# Patient Record
Sex: Female | Born: 1985 | Race: White | Hispanic: No | Marital: Married | Smoking: Never smoker
Health system: Southern US, Community
[De-identification: ages and names within clinical notes are randomized; demographics above are authoritative.]

## PROBLEM LIST (undated history)

## (undated) ENCOUNTER — Inpatient Hospital Stay (HOSPITAL_COMMUNITY): Payer: Self-pay

## (undated) DIAGNOSIS — D6800 Von Willebrand disease, unspecified: Secondary | ICD-10-CM

## (undated) DIAGNOSIS — D649 Anemia, unspecified: Secondary | ICD-10-CM

## (undated) HISTORY — DX: Von Willebrand disease, unspecified: D68.00

## (undated) HISTORY — DX: Von Willebrand's disease: D68.0

## (undated) HISTORY — DX: Hemochromatosis, unspecified: E83.119

## (undated) HISTORY — PX: BREAST ENHANCEMENT SURGERY: SHX7

## (undated) HISTORY — DX: Anemia, unspecified: D64.9

## (undated) HISTORY — PX: OTHER SURGICAL HISTORY: SHX169

---

## 2011-07-15 ENCOUNTER — Encounter (HOSPITAL_COMMUNITY): Payer: Self-pay | Admitting: Emergency Medicine

## 2011-07-15 ENCOUNTER — Emergency Department (HOSPITAL_COMMUNITY)
Admission: EM | Admit: 2011-07-15 | Discharge: 2011-07-15 | Disposition: A | Discharge status: Home / Self Care | Attending: Emergency Medicine | Admitting: Emergency Medicine

## 2011-07-15 DIAGNOSIS — R51 Headache: Secondary | ICD-10-CM | POA: Insufficient documentation

## 2011-07-15 DIAGNOSIS — J3489 Other specified disorders of nose and nasal sinuses: Secondary | ICD-10-CM | POA: Insufficient documentation

## 2011-07-15 DIAGNOSIS — R0982 Postnasal drip: Secondary | ICD-10-CM | POA: Insufficient documentation

## 2011-07-15 DIAGNOSIS — J329 Chronic sinusitis, unspecified: Secondary | ICD-10-CM | POA: Insufficient documentation

## 2011-07-15 MED ORDER — AMOXICILLIN 500 MG PO CAPS: 1000.0000 mg | ORAL_CAPSULE | Freq: Three times a day (TID) | ORAL | Status: AC

## 2011-07-15 MED ORDER — OXYMETAZOLINE HCL 0.05 % NA SOLN
1.0000 | Freq: Once | NASAL | Status: AC
  Administered 2011-07-15: 1 via NASAL
  Filled 2011-07-15: qty 15

## 2011-07-15 MED ORDER — AMOXICILLIN 500 MG PO CAPS
1000.0000 mg | ORAL_CAPSULE | Freq: Once | ORAL | Status: AC
  Administered 2011-07-15: 1000 mg via ORAL
  Filled 2011-07-15: qty 2

## 2011-07-15 NOTE — ED Notes (Signed)
Pt c/o facial pain and sore throat x2 days, reports she feels like she has a sinus infection. Pt is [redacted] weeks pregnant and is unsure of what she can take d/t her pregnancy. PT also reports nauseous d/t pain

## 2011-07-15 NOTE — ED Notes (Addendum)
PT. REPORTS SINUS CONGESTION , RUNNY NOSE WITH SINUS  PRESSURE AND  PAIN FOR SEVERAL DAYS . PT. STATES SHE IS [redacted] WEEKS PREGNANT.

## 2011-07-15 NOTE — ED Provider Notes (Signed)
History     CSN: [id number]  Arrival date & time 07/15/11  4098   First MD Initiated Contact with Patient 07/15/11 0121      Chief Complaint  Patient presents with  . Facial Pain    (Consider location/radiation/quality/duration/timing/severity/associated sxs/prior treatment) HPI Comments: 26 year old female with a history of approximately one week of nasal congestion and drainage followed by approximately 36 hours of gradually onset gradually worsening facial pain over the bilateral maxillary and frontal sinuses. This is constant, severe, not associated with fever or vomiting. She does have significant amount of purulent drainage from her nose, states that she is constantly blowing her nose and has postnasal drip. She does have a history of approximately one or 2 a year or sinus infections for most of her life. She is pregnant approximately 8 weeks and has arty followup with OB/GYN.  The history is provided by the patient and the spouse.    History reviewed. No pertinent past medical history.  History reviewed. No pertinent past surgical history.  No family history on file.  History  Substance Use Topics  . Smoking status: Never Smoker   . Smokeless tobacco: Not on file  . Alcohol Use: No    OB History    Grav Para Term Preterm Abortions TAB SAB Ect Mult Living   1               Review of Systems  Constitutional: Negative for fever and chills.  HENT: Positive for congestion, sore throat and sinus pressure. Negative for hearing loss, ear pain, facial swelling, mouth sores, trouble swallowing, neck pain, neck stiffness, dental problem and ear discharge.   Respiratory: Negative for cough and shortness of breath.   Gastrointestinal: Negative for nausea and vomiting.  Skin: Negative for rash.    Allergies  Aspirin  Home Medications   Current Outpatient Rx  Name Route Sig Dispense Refill  . STRESS FORMULA/ZINC PO TABS Oral Take 1 tablet by mouth daily.    Marland Kitchen CALCIUM  CARBONATE 600 MG PO TABS Oral Take 600 mg by mouth daily.    Marland Kitchen PRENATAL PO Oral Take 1 tablet by mouth daily.    . AMOXICILLIN 500 MG PO CAPS Oral Take 2 capsules (1,000 mg total) by mouth 3 (three) times daily. 60 capsule 0    BP 110/66  Pulse 118  Temp(Src) 98.6 F (37 C) (Oral)  Resp 19  SpO2 99%  Physical Exam  Nursing note and vitals reviewed. Constitutional: She appears well-developed and well-nourished. No distress.  HENT:  Head: Normocephalic and atraumatic.  Mouth/Throat: Oropharynx is clear and moist. No oropharyngeal exudate.       Bilateral nostrils with significant turbinate swelling, discharge in the nares, tenderness over the maxillary and frontal sinuses, no swelling, no rashes  Eyes: Conjunctivae and EOM are normal. Pupils are equal, round, and reactive to light. Right eye exhibits no discharge. Left eye exhibits no discharge. No scleral icterus.  Neck: Normal range of motion. Neck supple. No JVD present. No thyromegaly present.       Supple neck without any meningismus, no lymphadenopathy present  Cardiovascular: Regular rhythm, normal heart sounds and intact distal pulses.  Exam reveals no gallop and no friction rub.   No murmur heard.      Mild tachycardia  Pulmonary/Chest: Effort normal and breath sounds normal. No respiratory distress. She has no wheezes. She has no rales.  Musculoskeletal: Normal range of motion. She exhibits no edema and no tenderness.  Lymphadenopathy:  She has no cervical adenopathy.  Neurological: She is alert. Coordination normal.  Skin: Skin is warm and dry. No rash noted. No erythema.  Psychiatric: She has a normal mood and affect. Her behavior is normal.    ED Course  Procedures (including critical care time)  Labs Reviewed - No data to display No results found.   1. Sinusitis       MDM  On exam the patient has signs and symptoms consistent with a sinusitis. She has a pulse of approximately 105 on my exam, is tolerating  oral fluids and foods without difficulty, has been given amoxicillin and Afrin nasal spray in the emergency department. Will be discharged home to followup with family doctor. She is aware of indications for return.  Discharge Prescriptions include:  #1 amoxicillin  #2 Afrin        Vida Roller, MD 07/15/11 [phone]

## 2012-10-26 ENCOUNTER — Ambulatory Visit (INDEPENDENT_AMBULATORY_CARE_PROVIDER_SITE_OTHER): Payer: 59 | Admitting: Obstetrics and Gynecology

## 2012-10-26 ENCOUNTER — Encounter: Payer: Self-pay | Admitting: Obstetrics and Gynecology

## 2012-10-26 VITALS — BP 122/78 | Ht 68.0 in | Wt 148.0 lb

## 2012-10-26 DIAGNOSIS — E041 Nontoxic single thyroid nodule: Secondary | ICD-10-CM

## 2012-10-26 DIAGNOSIS — O0991 Supervision of high risk pregnancy, unspecified, first trimester: Secondary | ICD-10-CM

## 2012-10-26 DIAGNOSIS — O0990 Supervision of high risk pregnancy, unspecified, unspecified trimester: Secondary | ICD-10-CM

## 2012-10-26 DIAGNOSIS — Z3491 Encounter for supervision of normal pregnancy, unspecified, first trimester: Secondary | ICD-10-CM

## 2012-10-26 DIAGNOSIS — D680 Von Willebrand's disease: Secondary | ICD-10-CM

## 2012-10-26 DIAGNOSIS — Z3480 Encounter for supervision of other normal pregnancy, unspecified trimester: Secondary | ICD-10-CM

## 2012-10-26 MED ORDER — PRENATABS FA PO TABS: 1.0000 | ORAL_TABLET | ORAL | Status: DC

## 2012-10-26 NOTE — Patient Instructions (Signed)
Morning Sickness Morning sickness is when you feel sick to your stomach (nauseous) during pregnancy. This nauseous feeling may or may not come with throwing up (vomiting). It often occurs in the morning, but can be a problem any time of day. While morning sickness is unpleasant, it is usually harmless unless you develop severe and continual vomiting (hyperemesis gravidarum). This condition requires more intense treatment. CAUSES  The cause of morning sickness is not completely known but seems to be related to a sudden increase of two hormones:   Human chorionic gonadotropin (hCG).  Estrogen hormone. These are elevated in the first part of the pregnancy. TREATMENT  Do not use any medicines (prescription, over-the-counter, or herbal) for morning sickness without first talking to your caregiver. Some patients are helped by the following:  Vitamin B6 (25mg  every 8 hours) or vitamin B6 shots.  An antihistamine called doxylamine (10mg  every 8 hours).  The herbal medication ginger. HOME CARE INSTRUCTIONS   Taking multivitamins before getting pregnant can prevent or decrease the severity of morning sickness in most women.  Eat a piece of dry toast or unsalted crackers before getting out of bed in the morning.  Eat 5 or 6 small meals a day.  Eat dry and bland foods (rice, baked potato).  Do not drink liquids with your meals. Drink liquids between meals.  Avoid greasy, fatty, and spicy foods.  Get someone to cook for you if the smell of any food causes nausea and vomiting.  Avoid vitamin pills with iron because iron can cause nausea.  Snack on protein foods between meals if you are hungry.  Eat unsweetened gelatins for deserts.  Wear an acupressure wristband (worn for sea sickness) may be helpful.  Acupuncture may be helpful.  Do not smoke.  Get a humidifier to keep the air in your house free of odors. SEEK MEDICAL CARE IF:   Your home remedies are not working and you need  medication.  You feel dizzy or lightheaded.  You are losing weight.  You need help with your diet. SEEK IMMEDIATE MEDICAL CARE IF:   You have persistent and uncontrolled nausea and vomiting.  You pass out (faint).  You have a fever. MAKE SURE YOU:   Understand these instructions.  Will watch your condition.  Will get help right away if you are not doing well or get worse. Document Released: 04/09/2006 Document Revised: 05/11/2011 Document Reviewed: 02/04/2007 [facility] Patient Information 2014 [place], [place].

## 2012-10-26 NOTE — Progress Notes (Signed)
p-104  Last pap last year and denies ever having an abnormal pap Bedside U/S showed IUP with 120 BPM and CRL 4.64mm

## 2012-10-26 NOTE — Progress Notes (Signed)
   Subjective:    Natalie Esparza is a G2P0011 [redacted]w[redacted]d being seen today for her first obstetrical visit.  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 Patient does intend to breast feed. Pregnancy history fully reviewed.  Patient reports nausea.  Filed Vitals:   10/26/12 1411 10/26/12 1417  BP: 122/78   Height:  5\' 8"  (1.727 m)  Weight: 148 lb (67.132 kg)     HISTORY: OB History  Gravida Para Term Preterm AB SAB TAB Ectopic Multiple Living  2 0   1 1    1     # Outcome Date GA Lbr Len/2nd Weight Sex Delivery Anes PTL Lv  2 CUR           1 SAB 07/27/11             Past Medical History  Diagnosis Date  . Von Willebrand's disease   . Hemochromatosis   . Anemia    Past Surgical History  Procedure Laterality Date  . Arm pit surgery      cleaned out sinus  . Breast enhancement surgery     Family History  Problem Relation Age of Onset  . Diabetes Maternal Grandfather   . Heart attack Maternal Grandfather   . Hypertension Mother   . Cancer Maternal Grandfather      Exam    Uterus:     Pelvic Exam:    Perineum: No Hemorrhoids, Normal Perineum   Vulva: normal           Cervix: L/C   Adnexa: no mass, fullness, tenderness   Bony Pelvis: gynecoid  System: Breast:  normal appearance, no masses or tenderness   Skin: normal coloration and turgor, no rashes    Neurologic: oriented, normal, grossly non-focal   Extremities: normal strength, tone, and muscle mass   HEENT PERRLA and thyroid without masses (mass not appreciated; gland ULNS)   Mouth/Teeth mucous membranes moist, pharynx normal without lesions   Neck supple   Cardiovascular: regular rate and rhythm, no murmurs or gallops    Respiratory:  appears well, vitals normal, no respiratory distress, acyanotic, normal RR, ear and throat exam is normal, neck free of mass or lymphadenopathy, chest clear, no  wheezing, crepitations, rhonchi, normal symmetric air entry   Abdomen: soft, non-tender; bowel sounds normal; no masses,  no organomegaly   Urinary: urethral meatus normal  Breasts: augmentation scars. No discrete mass or nipple d/c. No lymphadenopathy    Assessment:    Pregnancy: [id number] Patient Active Problem List   Diagnosis Date Noted  . Supervision of high risk pregnancy in first trimester 10/26/2012  . Thyroid nodule 10/26/2012  . Von Willebrand disease 10/26/2012        Plan:     Initial labs drawn. ROI records including recent Pap.  Prenatal vitamins. RX sent Problem list reviewed and updated. Genetic Screening discussed First Screen: undecided. Offer next visit  Ultrasound discussed; fetal survey: requested.  Follow up in 4 weeks. 50% of 30 min visit spent on counseling and coordination of care.  N/V of pregnancy discussed   POE,DEIRDRE 10/26/2012

## 2012-10-27 LAB — OBSTETRIC PANEL
Antibody Screen: NEGATIVE
Basophils Absolute: 0 10*3/uL (ref 0.0–0.1)
Basophils Relative: 1 % (ref 0–1)
Eosinophils Absolute: 0.1 10*3/uL (ref 0.0–0.7)
Eosinophils Relative: 2 % (ref 0–5)
MCH: 31.3 pg (ref 26.0–34.0)
MCHC: 34.2 g/dL (ref 30.0–36.0)
MCV: 91.5 fL (ref 78.0–100.0)
Platelets: 365 10*3/uL (ref 150–400)
RDW: 12.6 % (ref 11.5–15.5)
WBC: 7.4 10*3/uL (ref 4.0–10.5)

## 2012-10-28 LAB — CULTURE, OB URINE
Colony Count: NO GROWTH
Organism ID, Bacteria: NO GROWTH

## 2012-11-01 LAB — CYSTIC FIBROSIS DIAGNOSTIC STUDY

## 2012-11-08 ENCOUNTER — Ambulatory Visit (INDEPENDENT_AMBULATORY_CARE_PROVIDER_SITE_OTHER): Payer: 59 | Admitting: Nurse Practitioner

## 2012-11-08 VITALS — BP 102/63 | Wt 151.0 lb

## 2012-11-08 DIAGNOSIS — R102 Pelvic and perineal pain unspecified side: Secondary | ICD-10-CM

## 2012-11-08 DIAGNOSIS — O26899 Other specified pregnancy related conditions, unspecified trimester: Secondary | ICD-10-CM

## 2012-11-08 DIAGNOSIS — R1084 Generalized abdominal pain: Secondary | ICD-10-CM

## 2012-11-08 DIAGNOSIS — O9989 Other specified diseases and conditions complicating pregnancy, childbirth and the puerperium: Secondary | ICD-10-CM

## 2012-11-08 DIAGNOSIS — Z3480 Encounter for supervision of other normal pregnancy, unspecified trimester: Secondary | ICD-10-CM

## 2012-11-08 DIAGNOSIS — R112 Nausea with vomiting, unspecified: Secondary | ICD-10-CM

## 2012-11-08 LAB — POCT URINALYSIS DIPSTICK
Leukocytes, UA: NEGATIVE
Urobilinogen, UA: NEGATIVE
pH, UA: 8.5

## 2012-11-08 MED ORDER — PROMETHAZINE HCL 25 MG PO TABS: 25.0000 mg | ORAL_TABLET | Freq: Four times a day (QID) | ORAL | Status: DC | PRN

## 2012-11-08 NOTE — Progress Notes (Signed)
Patient is having some increased lower pelvic cramps and feels as if her urine smells strong.  She has a history of miscarriage and just wants to be sure she does not have a urinary tract infection.  Urine dip is essentially wnl other than trace protein.  She did have a uti treated with cephalexin just prior to finding out she was pregnant.  Informal bedside ultrasound shows crl of 8 weeks 1 day which is consistant with lmp dating.  Positive FHR seen on ultrasound as well.

## 2012-11-08 NOTE — Patient Instructions (Signed)

## 2012-11-08 NOTE — Progress Notes (Signed)
S: Pt complains of inability to tolerate food, is limiting water intake as it causes reflux and thought she has a UTI as her urine was concentrated.  O: Alert , NAD. Positive FHT, informal U/S looked fine A: Nausea/ vomiting P: Phenergan 25 mg q 6 hrs prn Discussed options other than water/ rest/ reassurance

## 2012-11-22 ENCOUNTER — Ambulatory Visit (INDEPENDENT_AMBULATORY_CARE_PROVIDER_SITE_OTHER): Payer: BC Managed Care – PPO | Admitting: Obstetrics & Gynecology

## 2012-11-22 ENCOUNTER — Encounter: Payer: Self-pay | Admitting: Obstetrics & Gynecology

## 2012-11-22 VITALS — BP 118/76 | Wt 155.0 lb

## 2012-11-22 DIAGNOSIS — O0990 Supervision of high risk pregnancy, unspecified, unspecified trimester: Secondary | ICD-10-CM

## 2012-11-22 DIAGNOSIS — O0991 Supervision of high risk pregnancy, unspecified, first trimester: Secondary | ICD-10-CM

## 2012-11-22 NOTE — Patient Instructions (Signed)
Avian Influenza Viruses °Avian influenza or "bird flu" is also known as H5N1 virus. It occurs naturally in wild and domestic birds. Bird flu is easily spread (contagious) among birds and is deadly to them. Though rare, bird flu can cause disease in humans.  °CAUSES  °Infected birds can shed the H5N1 virus through:  °· Feces. °· Nasal secretions. °· Saliva. °Birds become infected when they come into contact with infected birds or contaminated surfaces. The bird flu virus is spread from country to country through international poultry trade or by migrating birds.  °MODES OF TRANSMISSION TO HUMANS °The bird flu virus does not normally infect humans. However, the virus can infect humans who have contact with infected birds, breath in dust or touch surfaces contaminated with the virus. Human to human transmission of the H5N1 virus has been rare. The virus lacks the ability to grow itself (replicate) in humans. However, because all influenza viruses can mutate, scientists are concerned the H5N1 virus will someday replicate itself and make human to human transmission easier. If this happens, an influenza "pandemic" could occur.  °SYMPTOMS  °· Symptoms of H5N1 virus are similar to other influenza viruses: °· Fever. °· Cough. °· Sore throat. °· Nausea and vomiting. °· Diarrhea. °· Muscle aches. °· Tiredness (malaise). °· Some people may get inflammation or redness of the eyes (conjunctivitis). °· Life-threatening complications may result in the death of the patient. These include: °· Viral pneumonia. °· Breathing (respiratory) distress syndrome. °· Multi-organ failure. °DIAGNOSIS  °· A person with a respiratory illness may be suffering from bird flu if direct or indirect contact has been made with infected birds. This includes handling or taking care of sick birds. The H5N1 virus may also be suspected if a person has breathed in particles or touched surfaces contaminated with the virus. °· In addition to the above symptoms,  a chest X-ray is useful to detect pneumonia. °· Fluid specimens such as a sputum sample may be sent to a laboratory for further investigation. °· Blood tests may be be done to help detect the H5N1 virus. °TREATMENT  °· The H5N1 virus has shown resistance to amantadine and rimantadine, which are two antiviral drugs commonly used for other influenza viruses. However, two other antivirals, oseltamavir and zanamavir, seem to be effective against the H5N1 strain. °· If bird flu is suspected in a person, treatment should start immediately without waiting for laboratory confirmation. °· Treatment for the H5N1 strain is essentially the same as treating other influenza viruses. °PREVENTION  °· Stay home from work, school and errands when you are sick. Not being in contact with other people will help stop the spread of illness. °· Cover your mouth and nose with your arm when coughing or sneezing. This may help those around you from getting sick. °· Wash your hands often with warm water and soap. Illnesses are often spread when a person touches something that is contaminated with germs and then touches his or her eyes, nose, or mouth. °· Antiviral medications can help prevent the flu. °· For optimal health, get plenty of rest, eat a healthy diet and exercise. °Document Released: 02/19/2003 Document Revised: 05/11/2011 Document Reviewed: 09/15/2007 °[employer]® Patient Information ©2014 [employer]. ° °

## 2012-11-22 NOTE — Progress Notes (Signed)
Routine visit. Nausea resolved, gaining weight. She and her husband decline genetic testing. Bedside u/s reveals singleton with normal FHR and visible movements. She denies problems. She declines flu vaccine today.

## 2012-11-22 NOTE — Progress Notes (Signed)
P - 92 - Pt had bloody show yesterday but nothing since then

## 2012-12-20 ENCOUNTER — Ambulatory Visit (INDEPENDENT_AMBULATORY_CARE_PROVIDER_SITE_OTHER): Payer: 59 | Admitting: Obstetrics & Gynecology

## 2012-12-20 VITALS — BP 106/66 | Wt 167.0 lb

## 2012-12-20 DIAGNOSIS — Z3402 Encounter for supervision of normal first pregnancy, second trimester: Secondary | ICD-10-CM

## 2012-12-20 DIAGNOSIS — Z3400 Encounter for supervision of normal first pregnancy, unspecified trimester: Secondary | ICD-10-CM

## 2012-12-20 NOTE — Progress Notes (Signed)
p-90  Headaches about everyday.

## 2012-12-20 NOTE — Progress Notes (Signed)
Quad screen next visit and will get anatomy US at 19 1/2 weeks.  No complaints.  Discussed weight gain and pt should decrease junk food and increase exercise. Pt already walks but will add swimming. Pt to met with hematologist and provide written recommendations for delivery.

## 2012-12-29 ENCOUNTER — Encounter: Payer: Self-pay | Admitting: Emergency Medicine

## 2012-12-29 ENCOUNTER — Encounter: Payer: 59 | Admitting: Obstetrics & Gynecology

## 2012-12-29 ENCOUNTER — Emergency Department (INDEPENDENT_AMBULATORY_CARE_PROVIDER_SITE_OTHER)
Admission: EM | Admit: 2012-12-29 | Discharge: 2012-12-29 | Disposition: A | Discharge status: Home / Self Care | Payer: 59 | Source: Home / Self Care | Attending: Family Medicine | Admitting: Family Medicine

## 2012-12-29 DIAGNOSIS — R590 Localized enlarged lymph nodes: Secondary | ICD-10-CM

## 2012-12-29 DIAGNOSIS — M26629 Arthralgia of temporomandibular joint, unspecified side: Secondary | ICD-10-CM

## 2012-12-29 DIAGNOSIS — R599 Enlarged lymph nodes, unspecified: Secondary | ICD-10-CM

## 2012-12-29 MED ORDER — MUPIROCIN CALCIUM 2 % EX CREA: TOPICAL_CREAM | Freq: Three times a day (TID) | CUTANEOUS | Status: DC

## 2012-12-29 NOTE — ED Provider Notes (Signed)
CSN: [id number]     Arrival date & time 12/29/12  4098 History   First MD Initiated Contact with Patient 12/29/12 [phone]     Chief Complaint  Patient presents with  . Otalgia      HPI Comments: Patient complains of onset of a sharp intermittent stabbing pain in the area of her right ear and right scalp less than 48 hours ago.  Yesterday the pain was more constant.  She has also noted soreness in her right neck but no sore throat.  Recently she has had some vague discomfort in right mandibular molar tooth.  She feels well otherwise.  No fevers, chills, and sweats.  She has noted a small sore on her right posterior scalp. She is [redacted] weeks pregnant.  She reports that she has a past history of bruxism, and has worn night guards in the past.  The history is provided by the patient.    Past Medical History  Diagnosis Date  . Von Willebrand's disease   . Hemochromatosis   . Anemia    Past Surgical History  Procedure Laterality Date  . Arm pit surgery      cleaned out sinus  . Breast enhancement surgery     Family History  Problem Relation Age of Onset  . Diabetes Maternal Grandfather   . Heart attack Maternal Grandfather   . Hypertension Mother   . Cancer Maternal Grandfather    History  Substance Use Topics  . Smoking status: Never Smoker   . Smokeless tobacco: Never Used  . Alcohol Use: Yes     Comment: glass of wine every other day   OB History   Grav Para Term Preterm Abortions TAB SAB Ect Mult Living   2 0   1  1   1      Review of Systems No sore throat No cough No pleuritic pain No wheezing No nasal congestion No post-nasal drainage No sinus pain/pressure No itchy/red eyes + right earache No hemoptysis No SOB No fever/chills No nausea No vomiting No abdominal pain No diarrhea No urinary symptoms No skin rashes No fatigue No myalgias No headache Used OTC meds without relief  Allergies  Aspirin  Home Medications   Current Outpatient Rx  Name   Route  Sig  Dispense  Refill  . mupirocin cream (BACTROBAN) 2 %   Topical   Apply topically 3 (three) times daily.   15 g   0   . Prenatal Vit-Fe Fumarate-FA (PRENATABS FA) TABS   Oral   Take 1 tablet by mouth 1 day or 1 dose.   30 each   3    BP 101/67  Pulse 87  Temp(Src) 98.2 F (36.8 C) (Oral)  Ht 5\' 8"  (1.727 m)  Wt 166 lb (75.297 kg)  BMI 25.25 kg/m2  SpO2 100%  LMP 09/08/2012 Physical Exam  Nursing note and vitals reviewed. Constitutional: She is oriented to person, place, and time. She appears well-developed and well-nourished. No distress.  HENT:  Head: Normocephalic and atraumatic.    Right Ear: External ear and ear canal normal. No drainage, swelling or tenderness. Tympanic membrane is not injected, not erythematous and not bulging. No middle ear effusion.  Left Ear: External ear normal.  Nose: Nose normal.  Mouth/Throat: Oropharynx is clear and moist. Normal dentition. No dental caries. No oropharyngeal exudate.  On the right posterior occipital scalp within hairline as noted on diagram is a 5mm dia benign appearing excoriation.  Lesion is not suggestive of  herpes zoster.  No swelling or drainage.  Teeth and gingiva are normal  There is distinct tenderness over the right temporomandibular joint   Eyes: Conjunctivae and EOM are normal. Pupils are equal, round, and reactive to light.  Neck: Normal range of motion. Neck supple. No thyromegaly present.    Right posterior neck reveals tender shotty adenopathy.    Cardiovascular: Normal heart sounds.   Pulmonary/Chest: Breath sounds normal.  Lymphadenopathy:    She has cervical adenopathy.  Neurological: She is alert and oriented to person, place, and time.  Skin: Skin is warm and dry.    ED Course  Procedures  none       MDM   1. TMJ arthralgia   2. Posterior cervical adenopathy; suspect a result of small non-specific excoriation on right posterior/occipital scalp.  Could also represent early  herpes zoster.    Rx for Bactroban cream for small lesion right occipital scalp  Resume using mouth guards at night.  Try applying ice pack to right TMJ several times daily.  May take Tylenol for pain.   Call if right scalp rash develops (begin Valtrex) Followup with Family Doctor if not improved in one week.     Lattie Haw, MD 12/29/12 1037

## 2012-12-29 NOTE — ED Notes (Signed)
Rt ear pain x 2 days, patient is [redacted] weeks pregnant

## 2013-01-18 ENCOUNTER — Encounter: Payer: 59 | Admitting: Obstetrics & Gynecology

## 2013-01-20 ENCOUNTER — Ambulatory Visit (INDEPENDENT_AMBULATORY_CARE_PROVIDER_SITE_OTHER): Payer: 59 | Admitting: Advanced Practice Midwife

## 2013-01-20 VITALS — BP 102/68 | Wt 172.0 lb

## 2013-01-20 DIAGNOSIS — O09899 Supervision of other high risk pregnancies, unspecified trimester: Secondary | ICD-10-CM

## 2013-01-20 DIAGNOSIS — O0990 Supervision of high risk pregnancy, unspecified, unspecified trimester: Secondary | ICD-10-CM

## 2013-01-20 DIAGNOSIS — O0991 Supervision of high risk pregnancy, unspecified, first trimester: Secondary | ICD-10-CM

## 2013-01-20 DIAGNOSIS — O09892 Supervision of other high risk pregnancies, second trimester: Secondary | ICD-10-CM

## 2013-01-20 DIAGNOSIS — D680 Von Willebrand's disease: Secondary | ICD-10-CM

## 2013-01-20 NOTE — Progress Notes (Signed)
P=93 

## 2013-01-20 NOTE — Progress Notes (Signed)
No concerns. Declines doppler due to worry about danger to baby. Tried to dispell misinformation, but pt still declines. Feels FM regularly including today. Expected anatomy scan today. Tearful because it wasn't scheduled yet (see note from previous visit.) Anatomy scan ordered. Declines genetic screening.

## 2013-02-01 ENCOUNTER — Ambulatory Visit (HOSPITAL_COMMUNITY)
Admission: RE | Admit: 2013-02-01 | Discharge: 2013-02-01 | Disposition: A | Discharge status: Home / Self Care | Payer: BC Managed Care – PPO | Source: Ambulatory Visit | Attending: Obstetrics & Gynecology | Admitting: Obstetrics & Gynecology

## 2013-02-01 DIAGNOSIS — O09892 Supervision of other high risk pregnancies, second trimester: Secondary | ICD-10-CM

## 2013-02-01 DIAGNOSIS — O352XX0 Maternal care for (suspected) hereditary disease in fetus, not applicable or unspecified: Secondary | ICD-10-CM | POA: Insufficient documentation

## 2013-02-01 DIAGNOSIS — Z3689 Encounter for other specified antenatal screening: Secondary | ICD-10-CM | POA: Insufficient documentation

## 2013-02-01 IMAGING — US US OB DETAIL+14 WK
1 series · 12 of 28 positions shown · non-contrast
Comparison: none

[Series 1: us ob detail +14 wk · 85 acquisitions, 12 frames shown]
[im 4/85]
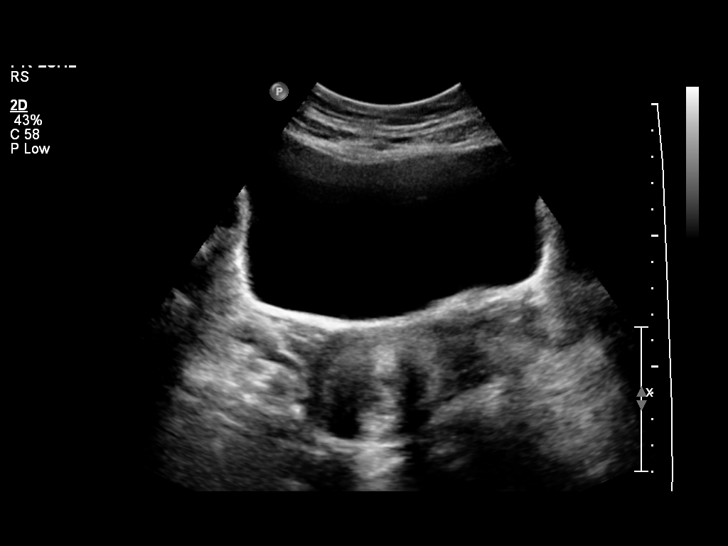
[im 10/85]
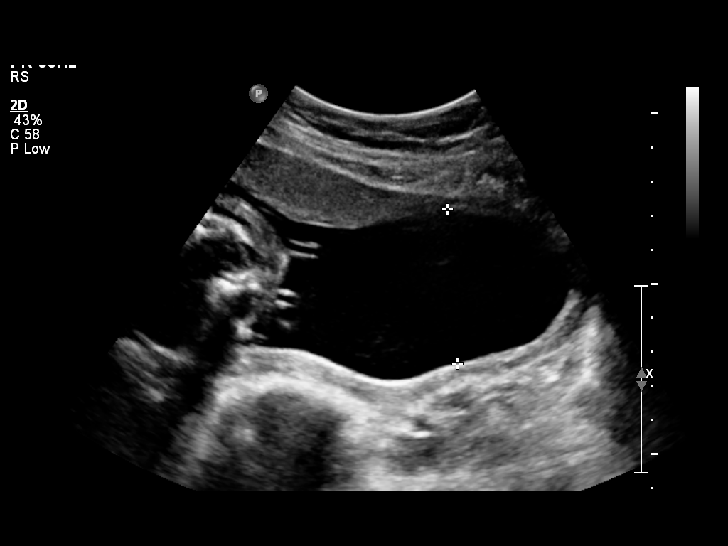
[im 16/85]
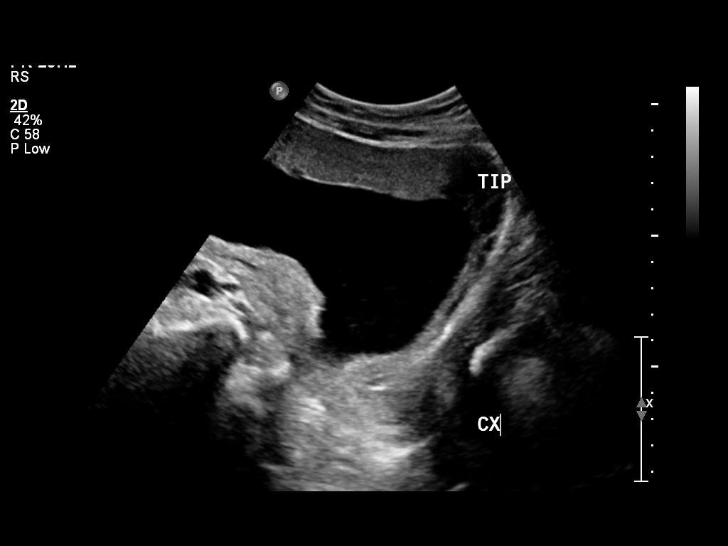
[im 25/85]
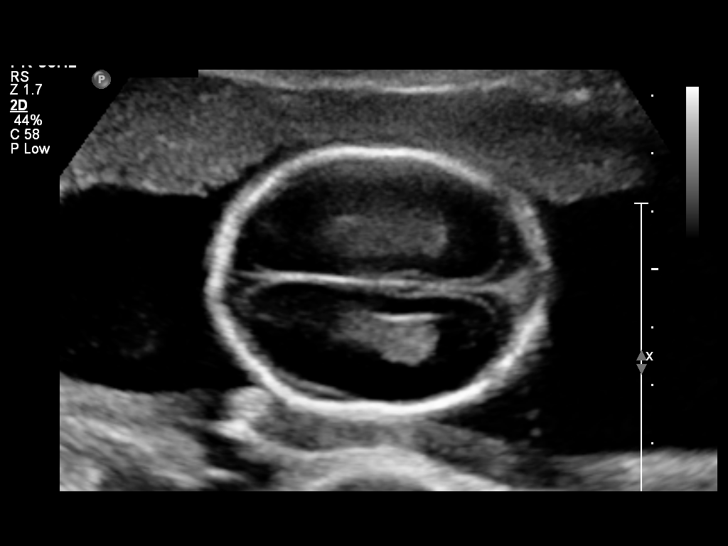
[im 32/85]
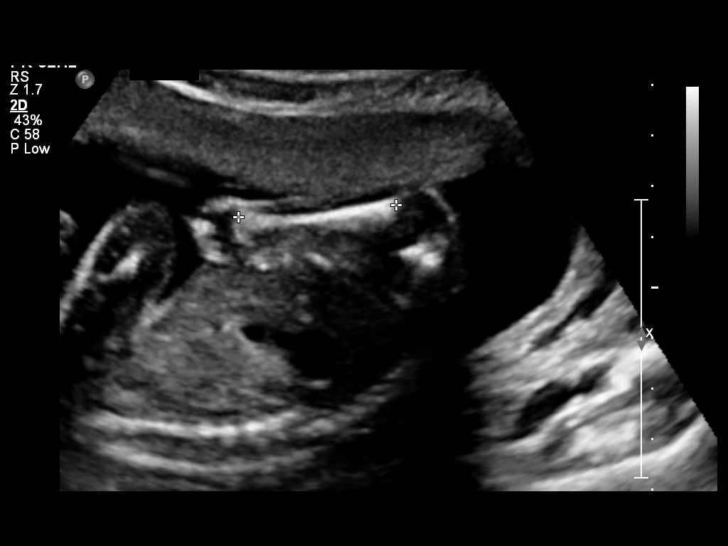
[im 38/85]
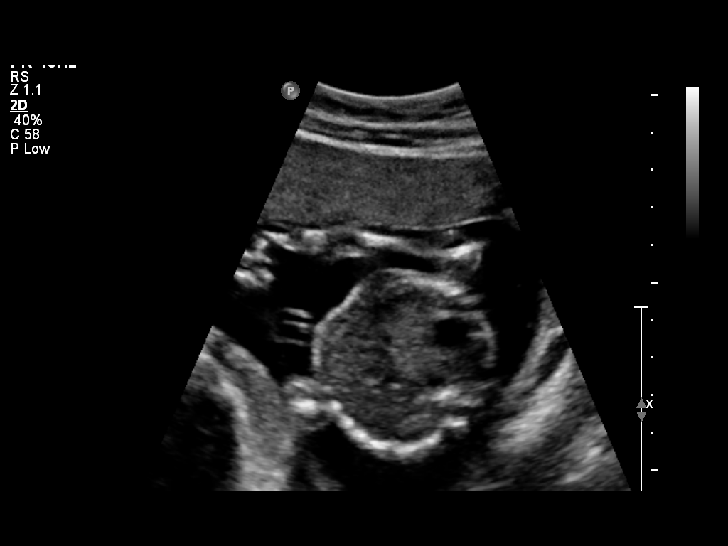
[im 47/85]
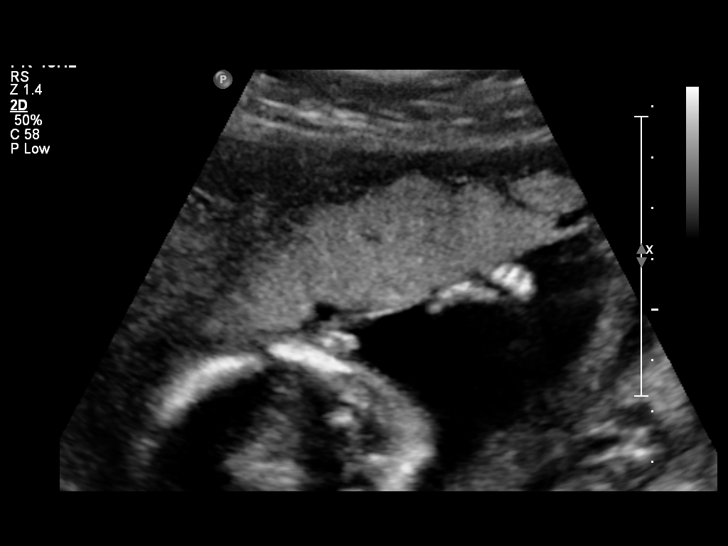
[im 53/85]
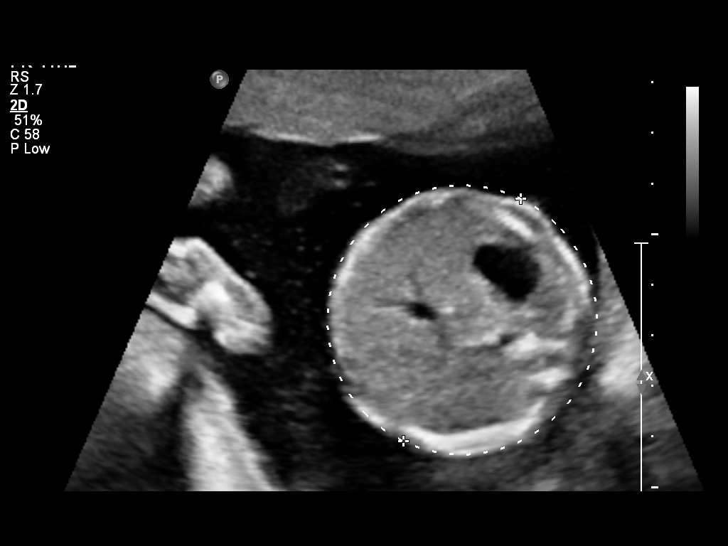
[im 60/85]
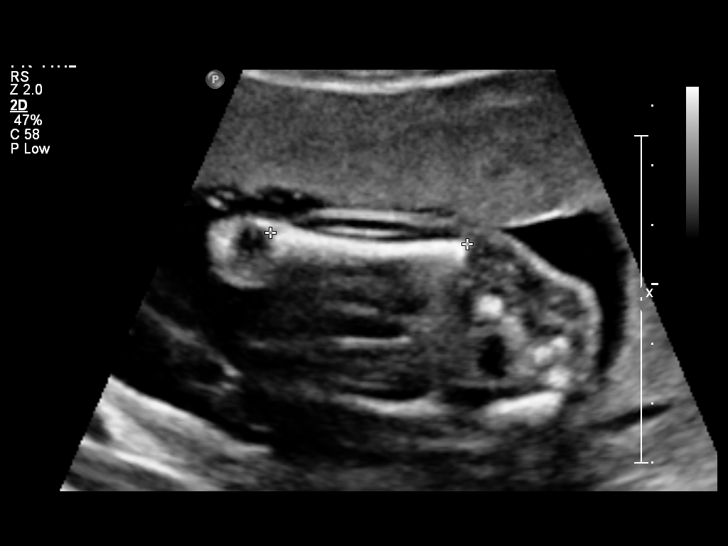
[im 69/85]
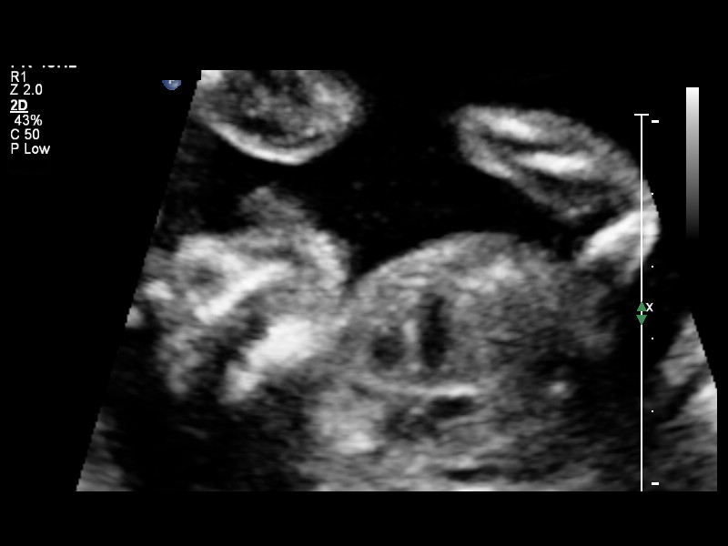
[im 75/85]
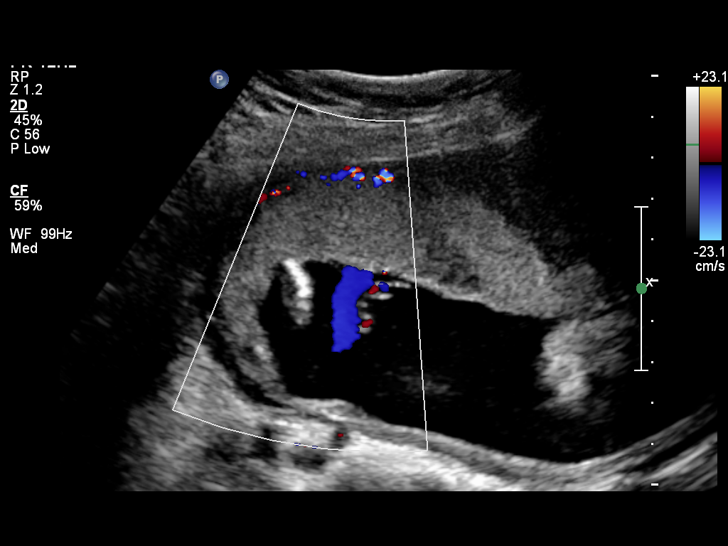
[im 81/85]
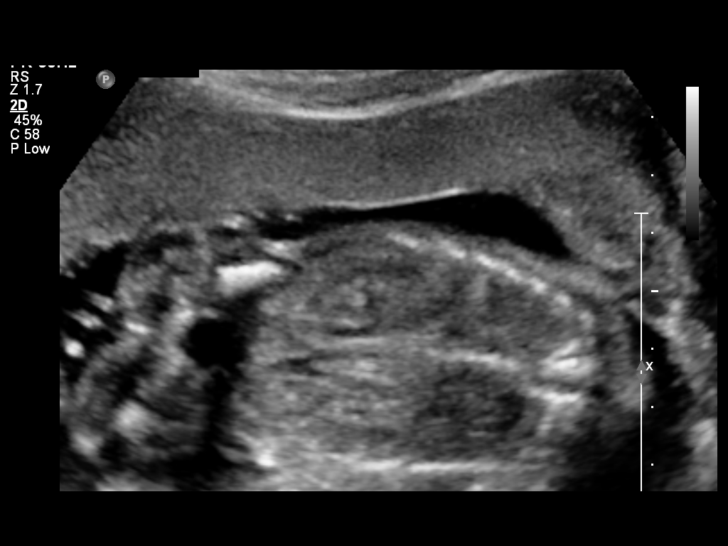

[12 of 28 positions shown; findings below may reference images not displayed]

OBSTETRICS REPORT
                      (Signed Final [DATE] [DATE])

Service(s) Provided

 US OB DETAIL + 14 WK                                  76811.0
Indications

 Basic anatomic survey                                 [VQ]
 History of genetic / anatomic abnormality - (pt with
 hemochromatosis, Von Willebrand dz)
Fetal Evaluation

 Num Of Fetuses:    1
 Fetal Heart Rate:  156                          bpm
 Cardiac Activity:  Observed

 Amniotic Fluid
 AFI FV:      Subjectively within normal limits
                                             Larg Pckt:    4.52  cm
 RUQ:   4.52    cm
Biometry

 BPD:     45.9  mm     G. Age:  19w 6d                CI:        68.94   70 - 86
                                                      FL/HC:      18.5   16.8 -

 HC:     176.6  mm     G. Age:  20w 1d       29  %    HC/AC:      1.07   1.09 -

 AC:     165.8  mm     G. Age:  21w 4d       80  %    FL/BPD:
 FL:      32.7  mm     G. Age:  20w 1d       34  %    FL/AC:      19.7   20 - 24
 HUM:     31.2  mm     G. Age:  20w 3d       49  %
 CER:     19.9  mm     G. Age:  19w 0d       16  %

 Est. FW:     382  gm    0 lb 13 oz      54  %
Gestational Age

 U/S Today:     20w 3d                                        EDD:   [DATE]
 Best:          20w 3d     Det. By:  U/S ([DATE])           EDD:   [DATE]
Anatomy

 Cranium:          Appears normal         Aortic Arch:      Appears normal
 Fetal Cavum:      Appears normal         Ductal Arch:      Appears normal
 Ventricles:       Appears normal         Diaphragm:        Appears normal
 Choroid Plexus:   Appears normal         Stomach:          Appears normal, left
                                                            sided
 Cerebellum:       Appears normal         Abdomen:          Appears normal
 Posterior Fossa:  Appears normal         Abdominal Wall:   Appears nml (cord
                                                            insert, abd wall)
 Nuchal Fold:      Not applicable (>20    Cord Vessels:     Appears normal (3
                   wks GA)                                  vessel cord)
 Face:             Appears normal         Kidneys:          Appear normal
                   (orbits and profile)
 Lips:             Appears normal         Bladder:          Appears normal
 Heart:            Appears normal         Spine:            Not well visualized
                   (4CH, axis, and
                   situs)
 RVOT:             Appears normal         Lower             Appears normal
                                          Extremities:
 LVOT:             Appears normal         Upper             Not well visualized
                                          Extremities:

 Other:  LUE appears normal. RUE not well visualized due to fetal position.
         Fetus appears to be a female. Heels and 5th digit visualized.
         Technically difficult due to fetal position.
Targeted Anatomy

 Fetal Central Nervous System
 Lat. Ventricles:  6.5                    Cisterna Magna:
Cervix Uterus Adnexa

 Cervical Length:    2.97     cm

 Cervix:       Normal appearance by transabdominal scan.
 Uterus:       No abnormality visualized.
 Cul De Sac:   No free fluid seen.
 Left Ovary:    No adnexal mass visualized.
 Right Ovary:   No adnexal mass visualized.

 Adnexa:     No abnormality visualized. No adnexal mass visualized.
Impression

 IUP at 20+3 weeks
 Normal detailed fetal anatomy; spine not optimally visualized
 Markers of aneuploidy: none
 Normal amniotic fluid volume
 EDC based on today's measurements
Recommendations

 Follow-up ultrasound in 4-6 weeks to complete anatomy
 survey

 questions or concerns.

## 2013-02-07 ENCOUNTER — Encounter: Payer: Self-pay | Admitting: Advanced Practice Midwife

## 2013-02-16 ENCOUNTER — Ambulatory Visit (INDEPENDENT_AMBULATORY_CARE_PROVIDER_SITE_OTHER): Payer: 59 | Admitting: Obstetrics & Gynecology

## 2013-02-16 VITALS — BP 106/71 | Wt 166.0 lb

## 2013-02-16 DIAGNOSIS — O9989 Other specified diseases and conditions complicating pregnancy, childbirth and the puerperium: Secondary | ICD-10-CM

## 2013-02-16 DIAGNOSIS — O0991 Supervision of high risk pregnancy, unspecified, first trimester: Secondary | ICD-10-CM

## 2013-02-16 DIAGNOSIS — M549 Dorsalgia, unspecified: Secondary | ICD-10-CM

## 2013-02-16 DIAGNOSIS — J029 Acute pharyngitis, unspecified: Secondary | ICD-10-CM

## 2013-02-16 DIAGNOSIS — O0990 Supervision of high risk pregnancy, unspecified, unspecified trimester: Secondary | ICD-10-CM

## 2013-02-16 NOTE — Addendum Note (Signed)
Addended by: Arne Cleveland on: 02/16/2013 04:26 PM   Modules accepted: Orders

## 2013-02-16 NOTE — Progress Notes (Signed)
Pt c/o sore throat.  Throat mildly red.  Strep cx sent.  Lungs clear.  Pt refuses rpt anatomy US and all fetal dopplers.

## 2013-02-16 NOTE — Progress Notes (Signed)
P = 98 

## 2013-02-17 ENCOUNTER — Encounter: Payer: 59 | Admitting: Family

## 2013-02-18 LAB — CULTURE, GROUP A STREP: Organism ID, Bacteria: NORMAL

## 2013-03-02 NOTE — L&D Delivery Note (Signed)
Delivery by Venida JarvisBraimah, SNM under direct supervision by me.  Plans to breastfeed, undecided about contraception, was thinking about BTL, advised that many <26yo regret permanent sterilization, discussed LARCs, will think about options.    Cheral MarkerKimberly R. Veer Elamin, CNM, [facility]NP-BC 06/03/2013 5:27 PM

## 2013-03-02 NOTE — L&D Delivery Note (Signed)
Delivery Note At 3:51 PM a viable female was delivered via Vaginal, Spontaneous Delivery (Presentation: Left Occiput Anterior).  APGAR: 7, 8; weight pending .   Placenta status: Intact, Spontaneous.  Cord: 3 vessels with the following complications: None.  Cord pH: not sent  Anesthesia: Epidural  Episiotomy: None Lacerations: 1st degree bilateral labial Suture Repair: 3.0 vicryl rapide Est. Blood Loss (mL): 350  Mom to postpartum.  Baby to Couplet care / Skin to Skin.  [name], [name] 06/03/2013, 4:43 PM

## 2013-03-17 ENCOUNTER — Inpatient Hospital Stay (HOSPITAL_COMMUNITY)
Admission: AD | Admit: 2013-03-17 | Discharge: 2013-03-17 | Disposition: A | Discharge status: Home / Self Care | Payer: BC Managed Care – PPO | Source: Ambulatory Visit | Attending: Obstetrics & Gynecology | Admitting: Obstetrics & Gynecology

## 2013-03-17 ENCOUNTER — Ambulatory Visit (INDEPENDENT_AMBULATORY_CARE_PROVIDER_SITE_OTHER): Payer: 59 | Admitting: Obstetrics and Gynecology

## 2013-03-17 ENCOUNTER — Encounter: Payer: Self-pay | Admitting: Obstetrics and Gynecology

## 2013-03-17 ENCOUNTER — Encounter (HOSPITAL_COMMUNITY): Payer: Self-pay | Admitting: *Deleted

## 2013-03-17 VITALS — BP 108/68 | Wt 171.0 lb

## 2013-03-17 DIAGNOSIS — R109 Unspecified abdominal pain: Secondary | ICD-10-CM | POA: Insufficient documentation

## 2013-03-17 DIAGNOSIS — N930 Postcoital and contact bleeding: Secondary | ICD-10-CM

## 2013-03-17 DIAGNOSIS — O4700 False labor before 37 completed weeks of gestation, unspecified trimester: Secondary | ICD-10-CM | POA: Insufficient documentation

## 2013-03-17 DIAGNOSIS — Z3400 Encounter for supervision of normal first pregnancy, unspecified trimester: Secondary | ICD-10-CM

## 2013-03-17 NOTE — Progress Notes (Signed)
P = 92 

## 2013-03-17 NOTE — MAU Provider Note (Signed)
None     Chief Complaint: Cramping and spotting after intercourse  Gracemarie BelarusSpain is  28 y.o. G2P0010 at 7033w3d presents complaining of cramping a little bit this evening.  During intercourse, she noticed some light vaginal bleeding. Since then, cramping has decreaed. PNV at [facility].  Obstetrical/Gynecological History: OB History   Grav Para Term Preterm Abortions TAB SAB Ect Mult Living   2 0   1  1        Past Medical History: Past Medical History  Diagnosis Date  . Von Willebrand's disease   . Hemochromatosis   . Anemia     Past Surgical History: Past Surgical History  Procedure Laterality Date  . Arm pit surgery      cleaned out sinus  . Breast enhancement surgery      Family History: Family History  Problem Relation Age of Onset  . Diabetes Maternal Grandfather   . Heart attack Maternal Grandfather   . Hypertension Mother   . Cancer Maternal Grandfather     Social History: History  Substance Use Topics  . Smoking status: Never Smoker   . Smokeless tobacco: Never Used  . Alcohol Use: Yes     Comment: glass of wine every other day  LAST TIME  10-09-2012    Allergies:  Allergies  Allergen Reactions  . Aspirin Other (See Comments)    Can not take it     Meds:  Prescriptions prior to admission  Medication Sig Dispense Refill  . Prenatal Vit-Fe Fumarate-FA (PRENATABS FA) TABS Take 1 tablet by mouth 1 day or 1 dose.  30 each  3  . mupirocin cream (BACTROBAN) 2 % Apply topically 3 (three) times daily.  15 g  0  . promethazine (PHENERGAN) 25 MG tablet         Review of Systems   Constitutional: Negative for fever and chills Eyes: Negative for visual disturbances Respiratory: Negative for shortness of breath, dyspnea Cardiovascular: Negative for chest pain or palpitations  Gastrointestinal: Negative for vomiting, diarrhea and constipation Genitourinary: Negative for dysuria and urgency Musculoskeletal: Negative for back pain, joint pain,  myalgias  Neurological: Negative for dizziness and headaches    Physical Exam  Blood pressure 105/66, pulse 91, temperature 98.5 F (36.9 C), temperature source Oral, resp. rate 20, height 5\' 7"  (1.702 m), weight 79.493 kg (175 lb 4 oz), last menstrual period 09/08/2012. GENERAL: Well-developed, well-nourished female in no acute distress.  LUNGS: Clear to auscultation bilaterally.  HEART: Regular rate and rhythm. ABDOMEN: Soft, nontender, nondistended, gravid.  EXTREMITIES: Nontender, no edema, 2+ distal pulses. DTR's 2+ PELVIC:  SSE:  Scant amount of blood in vagina, no active bleeding. Cx non friable.  Scant amount of normal appearing discharge.  Wet prep negative CERVICAL EXAM: Dilatation 0cm   Effacement 0%   Station not in pelvis   Presentation: unsure FHT:  Baseline rate 140 bpm   Variability moderate  Accelerations present   Decelerations none Contractions: Every 0 mins   Labs: Imaging Studies:  No results found.  Assessment: Mikhaela BelarusSpain is  28 y.o. G2P0010 at 6233w3d presents with post coital bleeding.  Plan: D/C home.  F/U if active bleeding resumes/worsens  CRESENZO-DISHMAN,Mahkai Fangman 1/16/20152:02 AM

## 2013-03-17 NOTE — MAU Note (Signed)
PT SAYS SHE STARTED VAG BLEEDING AT 1050PM - SHE WAS  HAVING SEX.   CRAMPING  STARTED  BEFORE SEX.   Marland Kitchen.  CRAMPING IS LESS NOW THAN BEFORE- BUT IS IN BACK..   IN TRIAGE- PAD  ON -  SMALL AMT LIGHT RED.   DENIES HSV AND MRSA.  GETS PNC AT  [facility].

## 2013-03-17 NOTE — Patient Instructions (Signed)
Second Trimester of Pregnancy The second trimester is from week 13 through week 28, months 4 through 6. The second trimester is often a time when you feel your best. Your body has also adjusted to being pregnant, and you begin to feel better physically. Usually, morning sickness has lessened or quit completely, you may have more energy, and you may have an increase in appetite. The second trimester is also a time when the fetus is growing rapidly. At the end of the sixth month, the fetus is about 9 inches long and weighs about 1 pounds. You will likely begin to feel the baby move (quickening) between 18 and 20 weeks of the pregnancy. BODY CHANGES Your body goes through many changes during pregnancy. The changes vary from woman to woman.   Your weight will continue to increase. You will notice your lower abdomen bulging out.  You may begin to get stretch marks on your hips, abdomen, and breasts.  You may develop headaches that can be relieved by medicines approved by your caregiver.  You may urinate more often because the fetus is pressing on your bladder.  You may develop or continue to have heartburn as a result of your pregnancy.  You may develop constipation because certain hormones are causing the muscles that push waste through your intestines to slow down.  You may develop hemorrhoids or swollen, bulging veins (varicose veins).  You may have back pain because of the weight gain and pregnancy hormones relaxing your joints between the bones in your pelvis and as a result of a shift in weight and the muscles that support your balance.  Your breasts will continue to grow and be tender.  Your gums may bleed and may be sensitive to brushing and flossing.  Dark spots or blotches (chloasma, mask of pregnancy) may develop on your face. This will likely fade after the baby is born.  A dark line from your belly button to the pubic area (linea nigra) may appear. This will likely fade after the  baby is born. WHAT TO EXPECT AT YOUR PRENATAL VISITS During a routine prenatal visit:  You will be weighed to make sure you and the fetus are growing normally.  Your blood pressure will be taken.  Your abdomen will be measured to track your baby's growth.  The fetal heartbeat will be listened to.  Any test results from the previous visit will be discussed. Your caregiver may ask you:  How you are feeling.  If you are feeling the baby move.  If you have had any abnormal symptoms, such as leaking fluid, bleeding, severe headaches, or abdominal cramping.  If you have any questions. Other tests that may be performed during your second trimester include:  Blood tests that check for:  Low iron levels (anemia).  Gestational diabetes (between 24 and 28 weeks).  Rh antibodies.  Urine tests to check for infections, diabetes, or protein in the urine.  An ultrasound to confirm the proper growth and development of the baby.  An amniocentesis to check for possible genetic problems.  Fetal screens for spina bifida and Down syndrome. HOME CARE INSTRUCTIONS   Avoid all smoking, herbs, alcohol, and unprescribed drugs. These chemicals affect the formation and growth of the baby.  Follow your caregiver's instructions regarding medicine use. There are medicines that are either safe or unsafe to take during pregnancy.  Exercise only as directed by your caregiver. Experiencing uterine cramps is a good sign to stop exercising.  Continue to eat regular,   healthy meals.  Wear a good support bra for breast tenderness.  Do not use hot tubs, steam rooms, or saunas.  Wear your seat belt at all times when driving.  Avoid raw meat, uncooked cheese, cat litter boxes, and soil used by cats. These carry germs that can cause birth defects in the baby.  Take your prenatal vitamins.  Try taking a stool softener (if your caregiver approves) if you develop constipation. Eat more high-fiber foods,  such as fresh vegetables or fruit and whole grains. Drink plenty of fluids to keep your urine clear or pale yellow.  Take warm sitz baths to soothe any pain or discomfort caused by hemorrhoids. Use hemorrhoid cream if your caregiver approves.  If you develop varicose veins, wear support hose. Elevate your feet for 15 minutes, 3 4 times a day. Limit salt in your diet.  Avoid heavy lifting, wear low heel shoes, and practice good posture.  Rest with your legs elevated if you have leg cramps or low back pain.  Visit your dentist if you have not gone yet during your pregnancy. Use a soft toothbrush to brush your teeth and be gentle when you floss.  A sexual relationship may be continued unless your caregiver directs you otherwise.  Continue to go to all your prenatal visits as directed by your caregiver. SEEK MEDICAL CARE IF:   You have dizziness.  You have mild pelvic cramps, pelvic pressure, or nagging pain in the abdominal area.  You have persistent nausea, vomiting, or diarrhea.  You have a bad smelling vaginal discharge.  You have pain with urination. SEEK IMMEDIATE MEDICAL CARE IF:   You have a fever.  You are leaking fluid from your vagina.  You have spotting or bleeding from your vagina.  You have severe abdominal cramping or pain.  You have rapid weight gain or loss.  You have shortness of breath with chest pain.  You notice sudden or extreme swelling of your face, hands, ankles, feet, or legs.  You have not felt your baby move in over an hour.  You have severe headaches that do not go away with medicine.  You have vision changes. Document Released: 02/10/2001 Document Revised: 10/19/2012 Document Reviewed: 04/19/2012 ExitCare Patient Information 2014 [employer].  

## 2013-03-17 NOTE — Progress Notes (Signed)
Doing well. INterested in waterbirth.

## 2013-03-17 NOTE — Discharge Instructions (Signed)
Vaginal Bleeding During Pregnancy, Third Trimester A small amount of bleeding (spotting) from the vagina is relatively common in pregnancy. Various things can cause bleeding or spotting in pregnancy. Sometimes the bleeding is normal and is not a problem. However, bleeding during the third trimester can also be a sign of something serious for the mother and the baby. Be sure to tell your health care provider about any vaginal bleeding right away.  Some possible causes of vaginal bleeding during the third trimester include:   The placenta may be partially or completely covering the opening to the cervix (placenta previa).   The placenta may have separated from the uterus (abruption of the placenta).   There may be an infection or growth on the cervix.   You may be starting labor, called discharging of the mucus plug.   The placenta may grow into the muscle layer of the uterus (placenta accreta).  HOME CARE INSTRUCTIONS  Watch your condition for any changes. The following actions may help to lessen any discomfort you are feeling:   Follow your health care provider's instructions for limiting your activity. If your health care provider orders bed rest, you may need to stay in bed and only get up to use the bathroom. However, your health care provider may allow you to continue light activity.  If needed, make plans for someone to help with your regular activities and responsibilities while you are on bed rest.  Keep track of the number of pads you use each day, how often you change pads, and how soaked (saturated) they are. Write this down.  Do not use tampons. Do not douche.  Do not have sexual intercourse or orgasms until approved by your health care provider.  Follow your health care provider's advice about lifting, driving, and physical activities.  If you pass any tissue from your vagina, save the tissue so you can show it to your health care provider.   Only take over-the-counter  or prescription medicines as directed by your health care provider.  Do not take aspirin because it can make you bleed.   Keep all follow-up appointments as directed by your health care provider. SEEK MEDICAL CARE IF:  You have any vaginal bleeding during any part of your pregnancy.  You have cramps or labor pains. SEEK IMMEDIATE MEDICAL CARE IF:   You have severe cramps or pain in your back or belly (abdomen).  You have a fever, not controlled by medicine.  You have chills.  You have a gush of fluid from the vagina.  You pass large clots or tissue from your vagina.  Your bleeding increases.  You feel lightheaded or weak.  You pass out.  You feel less movement or no movement of the baby.  MAKE SURE YOU:  Understand these instructions.  Will watch your condition.  Will get help right away if you are not doing well or get worse. Document Released: 05/09/2002 Document Revised: 12/07/2012 Document Reviewed: 10/24/2012 ExitCare Patient Information 2014 [employer].  

## 2013-03-31 ENCOUNTER — Encounter: Payer: Self-pay | Admitting: Family

## 2013-03-31 ENCOUNTER — Ambulatory Visit (INDEPENDENT_AMBULATORY_CARE_PROVIDER_SITE_OTHER): Payer: 59 | Admitting: Family

## 2013-03-31 VITALS — BP 104/63 | Wt 178.0 lb

## 2013-03-31 DIAGNOSIS — Z3480 Encounter for supervision of other normal pregnancy, unspecified trimester: Secondary | ICD-10-CM

## 2013-03-31 LAB — CBC
HCT: 32.1 % — ABNORMAL LOW (ref 36.0–46.0)
Hemoglobin: 10.8 g/dL — ABNORMAL LOW (ref 12.0–15.0)
MCH: 30.2 pg (ref 26.0–34.0)
MCHC: 33.6 g/dL (ref 30.0–36.0)
MCV: 89.7 fL (ref 78.0–100.0)
PLATELETS: 340 10*3/uL (ref 150–400)
RBC: 3.58 MIL/uL — ABNORMAL LOW (ref 3.87–5.11)
RDW: 12.3 % (ref 11.5–15.5)
WBC: 9.3 10*3/uL (ref 4.0–10.5)

## 2013-03-31 NOTE — Progress Notes (Signed)
p-87 

## 2013-03-31 NOTE — Progress Notes (Signed)
Pt declined fetal heart rate check; reports good movement.  Verbalizes desire for waterbirth, explained that with Von Willerbrands and reluctance for fetal doppler, not an appropriate candidate for waterbirth.  Pt states will allow doppler in tub.  Verbalized uncertain regarding appropriateness of her being a waterbirth candidate, will confirm with Dr. Penne LashLeggett.  1 hr obtained today.  Given resource information for doula agency since patient is interested in medication free labor and placenta encapsulation.

## 2013-04-01 LAB — HIV ANTIBODY (ROUTINE TESTING W REFLEX): HIV: NONREACTIVE

## 2013-04-01 LAB — RPR

## 2013-04-01 LAB — GLUCOSE TOLERANCE, 1 HOUR (50G) W/O FASTING: GLUCOSE 1 HOUR GTT: 112 mg/dL (ref 70–140)

## 2013-04-03 ENCOUNTER — Telehealth: Payer: Self-pay | Admitting: *Deleted

## 2013-04-03 NOTE — Telephone Encounter (Signed)
LM on voicemail that she did pass 1 hr GTT and that her Hgb is slightly decrease and needs to make sure that she is taking her PNV as directed.

## 2013-04-08 ENCOUNTER — Other Ambulatory Visit: Payer: Self-pay | Admitting: Family

## 2013-04-08 ENCOUNTER — Encounter: Payer: Self-pay | Admitting: Family

## 2013-04-08 DIAGNOSIS — D6800 Von Willebrand disease, unspecified: Secondary | ICD-10-CM

## 2013-04-08 NOTE — Telephone Encounter (Signed)
Discussed patient with staff at staff meeting on 04/06/13 > pt not appropriate candidate for waterbirth due to declining auscultation of fetal heart rate during prenatal visits and concern for patient not responding appropriately in case of emergency or concern during labor and/or birth in water.  Pt will be referred for MFM consult due to von willebrand's.

## 2013-04-10 ENCOUNTER — Telehealth: Payer: Self-pay | Admitting: *Deleted

## 2013-04-10 DIAGNOSIS — D6800 Von Willebrand disease, unspecified: Secondary | ICD-10-CM

## 2013-04-10 NOTE — Telephone Encounter (Signed)
Appt made with MFM on 04/18/13 for Von Willebrand's disease.

## 2013-04-14 ENCOUNTER — Ambulatory Visit (INDEPENDENT_AMBULATORY_CARE_PROVIDER_SITE_OTHER): Payer: 59 | Admitting: Advanced Practice Midwife

## 2013-04-14 VITALS — BP 101/65 | Wt 185.0 lb

## 2013-04-14 DIAGNOSIS — O0991 Supervision of high risk pregnancy, unspecified, first trimester: Secondary | ICD-10-CM

## 2013-04-14 DIAGNOSIS — D6800 Von Willebrand disease, unspecified: Secondary | ICD-10-CM

## 2013-04-14 DIAGNOSIS — O0990 Supervision of high risk pregnancy, unspecified, unspecified trimester: Secondary | ICD-10-CM

## 2013-04-14 NOTE — Progress Notes (Signed)
p-121  Appt with MFM on 04/21/13 for Von Willebrands's

## 2013-04-14 NOTE — Progress Notes (Signed)
Doing well.  Good fetal movement, denies vaginal bleeding, LOF, regular contractions.  [place] driving trip to [place] for baby shower at 34 weeks.  Recommend lots of water intake, stop walk every 2 hours, stretch legs while in car, know where nearest hospital located. Appt with MFM next week, taking waterbirth class.  Hopes MFM/hematology agree that risk of bleeding low enough she can have waterbirth.

## 2013-04-18 ENCOUNTER — Encounter (HOSPITAL_COMMUNITY): Payer: 59

## 2013-04-21 ENCOUNTER — Ambulatory Visit (HOSPITAL_COMMUNITY)
Admission: RE | Admit: 2013-04-21 | Discharge: 2013-04-21 | Disposition: A | Discharge status: Home / Self Care | Payer: BC Managed Care – PPO | Source: Ambulatory Visit | Attending: Obstetrics & Gynecology | Admitting: Obstetrics & Gynecology

## 2013-04-21 VITALS — BP 124/70 | HR 97 | Wt 187.0 lb

## 2013-04-21 DIAGNOSIS — O99119 Other diseases of the blood and blood-forming organs and certain disorders involving the immune mechanism complicating pregnancy, unspecified trimester: Principal | ICD-10-CM

## 2013-04-21 DIAGNOSIS — D689 Coagulation defect, unspecified: Secondary | ICD-10-CM | POA: Insufficient documentation

## 2013-04-21 DIAGNOSIS — D6800 Von Willebrand disease, unspecified: Secondary | ICD-10-CM

## 2013-04-21 NOTE — Consult Note (Signed)
Maternal Fetal Medicine Consultation  Requesting Provider(s): Elsie Lincoln, MD  Primary OB: [facility]  Reason for consultation: Marlow Baars disease, Hemochromatosis  HPI: Natalie Esparza is a 28 yo G2P0010 currently at 11 3/7 weeks - seen today for consultation due to a history of von Willebrand's disease.  Ms. Esparza reports that she was first diagnosed with von Willebrand's disease at age 72 due to heavy menses and some epistaxis.  She was treated with OCPs without any issues.  She reports that her symptoms have improved over the last several years.  She initially required DDAVP - but has not required treatment for many years.  She was seen by a Hematologist in August 2014 (records not available) and was told that her levels were normal and that they did not anticipate any problems.  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  Her brother was also diagnosed with this disorder.  She had surgeries in the past that did not require any special treatment and there were no bleeding problems associated with them. The patient also reports that she was diagnosed with Hemochromatosis by her Endocrinologist within the last year - has never been an issue and denies any family history of hemochromatosis / liver disorders.    OB History: OB History   Grav Para Term Preterm Abortions TAB SAB Ect Mult Living   2 0   1  1         PMH:  Past Medical History  Diagnosis Date  . Von Willebrand's disease   . Hemochromatosis   . Anemia     PSH:  Past Surgical History  Procedure Laterality Date  . Arm pit surgery      cleaned out sinus  . Breast enhancement surgery     Meds:  Current Outpatient Prescriptions on File Prior to Encounter  Medication Sig Dispense Refill  . Prenatal Vit-Fe Fumarate-FA (PRENATABS FA) TABS Take 1 tablet by mouth 1 day or 1 dose.  30 each  3   No current facility-administered medications on file prior to encounter.   Allergies:  Allergies   Allergen Reactions  . Aspirin Other (See Comments)    Can not take it    FH:  Family History  Problem Relation Age of Onset  . Diabetes Maternal Grandfather   . Heart attack Maternal Grandfather   . Hypertension Mother   . Cancer Maternal Grandfather    Soc:  History   Social History  . Marital Status: Married    Spouse Name: N/A    Number of Children: N/A  . Years of Education: N/A   Occupational History  . unemployed    Social History Main Topics  . Smoking status: Never Smoker   . Smokeless tobacco: Never Used  . Alcohol Use: Yes     Comment: glass of wine every other day  LAST TIME  10-09-2012  . Drug Use: No  . Sexual Activity: Yes    Partners: Male   Other Topics Concern  . Not on file   Social History Narrative  . No narrative on file    Review of Systems: no vaginal bleeding or cramping/contractions, no LOF, no nausea/vomiting. All other systems reviewed and are negative.  PE:   Filed Vitals:   04/21/13 1256  BP: 124/70  Pulse: 97    Labs: CBC    Component Value Date/Time   WBC 9.3 03/31/2013 1055   RBC 3.58* 03/31/2013 1055   HGB 10.8* 03/31/2013 1055  HCT 32.1* 03/31/2013 1055   PLT 340 03/31/2013 1055   MCV 89.7 03/31/2013 1055   MCH 30.2 03/31/2013 1055   MCHC 33.6 03/31/2013 1055   RDW 12.3 03/31/2013 1055   LYMPHSABS 1.9 10/26/2012 1428   MONOABS 0.5 10/26/2012 1428   EOSABS 0.1 10/26/2012 1428   BASOSABS 0.0 10/26/2012 1428     A/P: 1) Single IUP at 31 3/7 weeks         2) von Willebrand disease (? Type 2) - Hematology records not available at the time of consultation (record request has been sent by primary OB clinic).  Von Willebrand disease is an autosomal dominant disorder characterized by abnormal bleeding of varying severity. The disease has multiple subtypes, all of which involve a reduction, a qualitative abnormality, or both of von Willebrand factor (VWF). VWF is necessary for platelet adhesion and platelet aggregation, so an abnormal  quantity or quality of VWF causes bleeding by impairing blood clotting or platelet adhesion.  Levels of VWF rise in normal individuals and in most patients with VWD during the second and third trimesters of pregnancy.  However, plasma levels of factor VIII and VWF fall quickly after delivery and excessive bleeding may occur at this time. The average time of onset for postpartum hemorrhage in women with VWD is from 11 to 23 days post delivery. Thus, DDAVP or VWF replacement therapy may be required shortly after delivery, and for the first two to four weeks thereafter.   Low factor VIII appears to be the most important determinant of increased bleeding during delivery. It is generally recommended that factor VIII levels be at or above 50 percent for a cesarean section. Cesarean delivery should be reserved for the usual obstetrical indications; fetal intraventricular hemorrhage due to maternal or fetal VWD is rare.           3) Hemochromatosis - do not anticipate any issues during pregnancy, but may warrant further evaluation / work up after delivery.    Recommendations: 1) Would try to get records from previous Hematology consultation 2) Would recommend that Factor VIII and VWF:RCo be checked at 34-[redacted] weeks gestation. Wold suggest that VWF ristocetin cofactor activity and factor VIII levels of 50 IU/dL be achieved before delivery and maintained 3-5 days after ward. 3) Anesthesia consult prior to delivery to discuss regional anesthesia  / analgesia 4) Would contact Hematology regarding recommendations for DDAVP or factor VIII concentrates in labor. DDAVP can be administered intravenously, by subcutaneous injection, or by intranasal spray. If needed for delivery, would suggest that DDAVP be given after the beginning of labor and as close to the time of delivery as can be estimated.   A dose of 0.3 micrograms/kg (maximum 20 micrograms) is diluted in 50 mL of normal saline and infused over 20 to 30 minutes; the  same dose is given with subcutaneous therapy. 5) vWD is an autosomal dominant disorder - the patient's offspring have a 50% chance of being affected and would recommend that the infant be tested at some point in the future.  Thank you for the opportunity to be a part of the care of Latania BelarusSpain. Please contact our office if we can be of further assistance.   I spent approximately 30 minutes with this patient with over 50% of time spent in face-to-face counseling.  Alpha GulaPaul Bessie Boyte, MD Maternal-Fetal Medicine

## 2013-04-24 ENCOUNTER — Ambulatory Visit (INDEPENDENT_AMBULATORY_CARE_PROVIDER_SITE_OTHER): Payer: 59 | Admitting: Advanced Practice Midwife

## 2013-04-24 ENCOUNTER — Encounter: Payer: Self-pay | Admitting: Advanced Practice Midwife

## 2013-04-24 VITALS — BP 106/76 | Wt 186.0 lb

## 2013-04-24 DIAGNOSIS — D6800 Von Willebrand disease, unspecified: Secondary | ICD-10-CM

## 2013-04-24 DIAGNOSIS — Z3480 Encounter for supervision of other normal pregnancy, unspecified trimester: Secondary | ICD-10-CM

## 2013-04-24 NOTE — Progress Notes (Signed)
Pt c/o's dizziness and nausea  p-  100  Red and itchy under both eyes

## 2013-04-24 NOTE — Addendum Note (Signed)
Encounter addended by: Ty HiltsKatherine E Braidon Chermak, RN on: 04/24/2013 11:48 AM<BR>     Documentation filed: Charges VN

## 2013-04-24 NOTE — Progress Notes (Signed)
Pos FM. Declines doppler. No mention of waterbirth in Dr. Fredda Esparza's note. Recommends labs at 34-36 weeks and hematology referral (done). Itchy red areas under eyes and next to mouth and nose. Recommend cortisone cream and keeping skin well moisturized. Benadryl PRN.

## 2013-04-24 NOTE — Patient Instructions (Signed)
Benadryl, Zyrtec, Cortisone cream, Claritin  Contact Dermatitis Contact dermatitis is a reaction to certain substances that touch the skin. Contact dermatitis can be either irritant contact dermatitis or allergic contact dermatitis. Irritant contact dermatitis does not require previous exposure to the substance for a reaction to occur.Allergic contact dermatitis only occurs if you have been exposed to the substance before. Upon a repeat exposure, your body reacts to the substance.  CAUSES  Many substances can cause contact dermatitis. Irritant dermatitis is most commonly caused by repeated exposure to mildly irritating substances, such as:  Makeup.  Soaps.  Detergents.  Bleaches.  Acids.  Metal salts, such as nickel. Allergic contact dermatitis is most commonly caused by exposure to:  Poisonous plants.  Chemicals (deodorants, shampoos).  Jewelry.  Latex.  Neomycin in triple antibiotic cream.  Preservatives in products, including clothing. SYMPTOMS  The area of skin that is exposed may develop:  Dryness or flaking.  Redness.  Cracks.  Itching.  Pain or a burning sensation.  Blisters. With allergic contact dermatitis, there may also be swelling in areas such as the eyelids, mouth, or genitals.  DIAGNOSIS  Your caregiver can usually tell what the problem is by doing a physical exam. In cases where the cause is uncertain and an allergic contact dermatitis is suspected, a patch skin test may be performed to help determine the cause of your dermatitis. TREATMENT Treatment includes protecting the skin from further contact with the irritating substance by avoiding that substance if possible. Barrier creams, powders, and gloves may be helpful. Your caregiver may also recommend:  Steroid creams or ointments applied 2 times daily. For best results, soak the rash area in cool water for 20 minutes. Then apply the medicine. Cover the area with a plastic wrap. You can store the  steroid cream in the refrigerator for a "chilly" effect on your rash. That may decrease itching. Oral steroid medicines may be needed in more severe cases.  Antibiotics or antibacterial ointments if a skin infection is present.  Antihistamine lotion or an antihistamine taken by mouth to ease itching.  Lubricants to keep moisture in your skin.  Burow's solution to reduce redness and soreness or to dry a weeping rash. Mix one packet or tablet of solution in 2 cups cool water. Dip a clean washcloth in the mixture, wring it out a bit, and put it on the affected area. Leave the cloth in place for 30 minutes. Do this as often as possible throughout the day.  Taking several cornstarch or baking soda baths daily if the area is too large to cover with a washcloth. Harsh chemicals, such as alkalis or acids, can cause skin damage that is like a burn. You should flush your skin for 15 to 20 minutes with cold water after such an exposure. You should also seek immediate medical care after exposure. Bandages (dressings), antibiotics, and pain medicine may be needed for severely irritated skin.  HOME CARE INSTRUCTIONS  Avoid the substance that caused your reaction.  Keep the area of skin that is affected away from hot water, soap, sunlight, chemicals, acidic substances, or anything else that would irritate your skin.  Do not scratch the rash. Scratching may cause the rash to become infected.  You may take cool baths to help stop the itching.  Only take over-the-counter or prescription medicines as directed by your caregiver.  See your caregiver for follow-up care as directed to make sure your skin is healing properly. SEEK MEDICAL CARE IF:   Your  condition is not better after 3 days of treatment.  You seem to be getting worse.  You see signs of infection such as swelling, tenderness, redness, soreness, or warmth in the affected area.  You have any problems related to your medicines. Document  Released: 02/14/2000 Document Revised: 05/11/2011 Document Reviewed: 07/22/2010 [facility] Patient Information 2014 [place], [place].

## 2013-04-27 ENCOUNTER — Encounter: Payer: Self-pay | Admitting: Family

## 2013-05-09 ENCOUNTER — Telehealth: Payer: Self-pay | Admitting: Hematology & Oncology

## 2013-05-09 NOTE — Telephone Encounter (Signed)
Spoke w NEW PATIENT today to remind them of their appointment with Dr. Ennever. Also, advised them to bring all meds and insurance information. ° °

## 2013-05-11 ENCOUNTER — Encounter: Payer: Self-pay | Admitting: Hematology & Oncology

## 2013-05-11 ENCOUNTER — Ambulatory Visit (HOSPITAL_BASED_OUTPATIENT_CLINIC_OR_DEPARTMENT_OTHER): Payer: 59 | Admitting: Hematology & Oncology

## 2013-05-11 ENCOUNTER — Encounter: Payer: Self-pay | Admitting: Obstetrics and Gynecology

## 2013-05-11 ENCOUNTER — Ambulatory Visit (INDEPENDENT_AMBULATORY_CARE_PROVIDER_SITE_OTHER): Payer: 59 | Admitting: Obstetrics and Gynecology

## 2013-05-11 ENCOUNTER — Ambulatory Visit: Payer: 59

## 2013-05-11 ENCOUNTER — Other Ambulatory Visit (HOSPITAL_BASED_OUTPATIENT_CLINIC_OR_DEPARTMENT_OTHER): Payer: 59 | Admitting: Lab

## 2013-05-11 VITALS — BP 100/64 | HR 91 | Temp 98.1°F | Resp 14 | Ht 68.0 in | Wt 195.0 lb

## 2013-05-11 VITALS — BP 109/66 | Wt 195.0 lb

## 2013-05-11 DIAGNOSIS — Z3480 Encounter for supervision of other normal pregnancy, unspecified trimester: Secondary | ICD-10-CM

## 2013-05-11 DIAGNOSIS — D6800 Von Willebrand disease, unspecified: Secondary | ICD-10-CM

## 2013-05-11 DIAGNOSIS — Z331 Pregnant state, incidental: Secondary | ICD-10-CM

## 2013-05-11 LAB — CBC WITH DIFFERENTIAL (CANCER CENTER ONLY)
BASO#: 0 10*3/uL (ref 0.0–0.2)
BASO%: 0.3 % (ref 0.0–2.0)
EOS%: 1.7 % (ref 0.0–7.0)
Eosinophils Absolute: 0.2 10*3/uL (ref 0.0–0.5)
HEMATOCRIT: 31.5 % — AB (ref 34.8–46.6)
HGB: 10.4 g/dL — ABNORMAL LOW (ref 11.6–15.9)
LYMPH#: 1.8 10*3/uL (ref 0.9–3.3)
LYMPH%: 19.7 % (ref 14.0–48.0)
MCH: 30.1 pg (ref 26.0–34.0)
MCHC: 33 g/dL (ref 32.0–36.0)
MCV: 91 fL (ref 81–101)
MONO#: 0.7 10*3/uL (ref 0.1–0.9)
MONO%: 7.4 % (ref 0.0–13.0)
NEUT#: 6.5 10*3/uL (ref 1.5–6.5)
NEUT%: 70.9 % (ref 39.6–80.0)
Platelets: 298 10*3/uL (ref 145–400)
RBC: 3.46 10*6/uL — ABNORMAL LOW (ref 3.70–5.32)
RDW: 12.1 % (ref 11.1–15.7)
WBC: 9.2 10*3/uL (ref 3.9–10.0)

## 2013-05-11 LAB — CHCC SATELLITE - SMEAR

## 2013-05-11 NOTE — Patient Instructions (Signed)
Contraception Choices Contraception (birth control) is the use of any methods or devices to prevent pregnancy. Below are some methods to help avoid pregnancy. HORMONAL METHODS   Contraceptive implant This is a thin, plastic tube containing progesterone hormone. It does not contain estrogen hormone. Your health care provider inserts the tube in the inner part of the upper arm. The tube can remain in place for up to 3 years. After 3 years, the implant must be removed. The implant prevents the ovaries from releasing an egg (ovulation), thickens the cervical mucus to prevent sperm from entering the uterus, and thins the lining of the inside of the uterus.  Progesterone-only injections These injections are given every 3 months by your health care provider to prevent pregnancy. This synthetic progesterone hormone stops the ovaries from releasing eggs. It also thickens cervical mucus and changes the uterine lining. This makes it harder for sperm to survive in the uterus.  Birth control pills These pills contain estrogen and progesterone hormone. They work by preventing the ovaries from releasing eggs (ovulation). They also cause the cervical mucus to thicken, preventing the sperm from entering the uterus. Birth control pills are prescribed by a health care provider.Birth control pills can also be used to treat heavy periods.  Minipill This type of birth control pill contains only the progesterone hormone. They are taken every day of each month and must be prescribed by your health care provider.  Birth control patch The patch contains hormones similar to those in birth control pills. It must be changed once a week and is prescribed by a health care provider.  Vaginal ring The ring contains hormones similar to those in birth control pills. It is left in the vagina for 3 weeks, removed for 1 week, and then a new one is put back in place. The patient must be comfortable inserting and removing the ring from the  vagina.A health care provider's prescription is necessary.  Emergency contraception Emergency contraceptives prevent pregnancy after unprotected sexual intercourse. This pill can be taken right after sex or up to 5 days after unprotected sex. It is most effective the sooner you take the pills after having sexual intercourse. Most emergency contraceptive pills are available without a prescription. Check with your pharmacist. Do not use emergency contraception as your only form of birth control. BARRIER METHODS   Female condom This is a thin sheath (latex or rubber) that is worn over the penis during sexual intercourse. It can be used with spermicide to increase effectiveness.  Female condom. This is a soft, loose-fitting sheath that is put into the vagina before sexual intercourse.  Diaphragm This is a soft, latex, dome-shaped barrier that must be fitted by a health care provider. It is inserted into the vagina, along with a spermicidal jelly. It is inserted before intercourse. The diaphragm should be left in the vagina for 6 to 8 hours after intercourse.  Cervical cap This is a round, soft, latex or plastic cup that fits over the cervix and must be fitted by a health care provider. The cap can be left in place for up to 48 hours after intercourse.  Sponge This is a soft, circular piece of polyurethane foam. The sponge has spermicide in it. It is inserted into the vagina after wetting it and before sexual intercourse.  Spermicides These are chemicals that kill or block sperm from entering the cervix and uterus. They come in the form of creams, jellies, suppositories, foam, or tablets. They do not require a   prescription. They are inserted into the vagina with an applicator before having sexual intercourse. The process must be repeated every time you have sexual intercourse. INTRAUTERINE CONTRACEPTION  Intrauterine device (IUD) This is a T-shaped device that is put in a woman's uterus during a  menstrual period to prevent pregnancy. There are 2 types:  Copper IUD This type of IUD is wrapped in copper wire and is placed inside the uterus. Copper makes the uterus and fallopian tubes produce a fluid that kills sperm. It can stay in place for 10 years.  Hormone IUD This type of IUD contains the hormone progestin (synthetic progesterone). The hormone thickens the cervical mucus and prevents sperm from entering the uterus, and it also thins the uterine lining to prevent implantation of a fertilized egg. The hormone can weaken or kill the sperm that get into the uterus. It can stay in place for 3 5 years, depending on which type of IUD is used. PERMANENT METHODS OF CONTRACEPTION  Female tubal ligation This is when the woman's fallopian tubes are surgically sealed, tied, or blocked to prevent the egg from traveling to the uterus.  Hysteroscopic sterilization This involves placing a small coil or insert into each fallopian tube. Your doctor uses a technique called hysteroscopy to do the procedure. The device causes scar tissue to form. This results in permanent blockage of the fallopian tubes, so the sperm cannot fertilize the egg. It takes about 3 months after the procedure for the tubes to become blocked. You must use another form of birth control for these 3 months.  Female sterilization This is when the female has the tubes that carry sperm tied off (vasectomy).This blocks sperm from entering the vagina during sexual intercourse. After the procedure, the man can still ejaculate fluid (semen). NATURAL PLANNING METHODS  Natural family planning This is not having sexual intercourse or using a barrier method (condom, diaphragm, cervical cap) on days the woman could become pregnant.  Calendar method This is keeping track of the length of each menstrual cycle and identifying when you are fertile.  Ovulation method This is avoiding sexual intercourse during ovulation.  Symptothermal method This is  avoiding sexual intercourse during ovulation, using a thermometer and ovulation symptoms.  Post ovulation method This is timing sexual intercourse after you have ovulated. Regardless of which type or method of contraception you choose, it is important that you use condoms to protect against the transmission of sexually transmitted infections (STIs). Talk with your health care provider about which form of contraception is most appropriate for you. Document Released: 02/16/2005 Document Revised: 10/19/2012 Document Reviewed: 08/11/2012 [employer] Patient Information 2014 [employer].  

## 2013-05-11 NOTE — Progress Notes (Signed)
P-[id number] 

## 2013-05-11 NOTE — Progress Notes (Signed)
Doing well. Fetus active. Declines doppler> FM elicited anad attempt to hear with cardboard roll> fleeting could not count. Discussed waterbirth plans and agrees to monitoring per protocol. Forgot her class completion certificate and will bring next. Discussed contraception - undecided. Von WIllebrand dz should not pose IP problem, more a PP concern.

## 2013-05-11 NOTE — Progress Notes (Signed)
Referral MD  Reason for Referral: Von Willebrand's disease and a third trimester pregnancy Hemochromatosis   Chief Complaint  Patient presents with  . NEW PATIENT  : I have f von Willebrand disease. I am going to have a baby on April 20. I also hemochromatosis  HPI: Natalie Esparza is a very nice 28 year old white female. [place] She and her husband moved down here in June of last year.  She is in her third trimester pregnancy. She is followed by Dr. Penne LashLeggett. Natalie Esparza has von Willebrand disease. She was diagnosed with this when she started her cycles. This was when she was 15. She has had past surgeries. She said that she did not need any treatment for bleeding prior to her surgeries.  She says that her mother and brother have von Willebrand's.  Her pregnancy has been uneventful. She is due on April 20. Her baby, a girl, is doing fine.  There's been no bleeding. He's had some ankle edema. She's gained some weight. She's had no cough. There's been no fever. She is taking her prenatal vitamins.  She plans for a natural delivery. She wants a water delivery. She does not plan on anesthesia.  Lab work has been done previously showed a platelet count 140,000. This was done in late January.  Final see any past von Willebrand's studies that have been done.  She says that she has hemachromatosis. She's not sure what type she has. She's not sure who else in her family has this. She said this was found when she had blood work for thyroid nodule in her on her levels were elevated.  She's not noticed any bruising. There's been no nosebleeds. She's had no bleeding with her bowels or bladder.           Past Medical History  Diagnosis Date  . Von Willebrand's disease   . Hemochromatosis   . Anemia   :  Past Surgical History  Procedure Laterality Date  . Arm pit surgery      cleaned out sinus  . Breast enhancement surgery    :  Current outpatient  prescriptions:Docosahexaenoic Acid (DHA PO), Take by mouth., Disp: , Rfl: ;  Prenatal Vit-Fe Fumarate-FA (PRENATABS FA) TABS, Take 1 tablet by mouth 1 day or 1 dose., Disp: 30 each, Rfl: 3:  :  Allergies  Allergen Reactions  . Aspirin Other (See Comments)    Can not take it   :  Family History  Problem Relation Age of Onset  . Diabetes Maternal Grandfather   . Heart attack Maternal Grandfather   . Hypertension Mother   . Cancer Maternal Grandfather   :  History   Social History  . Marital Status: Married    Spouse Name: N/A    Number of Children: N/A  . Years of Education: N/A   Occupational History  . unemployed    Social History Main Topics  . Smoking status: Never Smoker   . Smokeless tobacco: Never Used     Comment: never used tobacco  . Alcohol Use: Yes     Comment: glass of wine every other day  LAST TIME  10-09-2012  . Drug Use: No  . Sexual Activity: Yes    Partners: Male   Other Topics Concern  . Not on file   Social History Narrative  . No narrative on file  :  Pertinent items are noted in HPI.  Exam: @IPVITALS @  well-developed well-nourished white female. Her vital  signs show a temperature work of 98.1. Blood pressure 100/64. Weight 195 pounds. Pulse 91. Head and neck exam shows no ocular or oral lesions. There is no adenopathy in the neck. Thyroid is nonpalpable. Lungs are clear. Cardiac exam regular in rhythm with no murmurs rubs or bruits. Abdomen is soft. Has good bowel sounds. She's pregnant. There's no palpable liver or spleen tip. Extremities shows some trace edema in her lower legs. She has good pulses. She has good range of motion of her joints. Back exam no tenderness over the spine. Skin exam no rashes or ecchymoses are noted. Neurological exam no focal neurological deficits.    Recent Labs  05/11/13 1525  WBC 9.2  HGB 10.4*  HCT 31.5*  PLT 298   No results found for this basename: NA, K, CL, CO2, GLUCOSE, BUN, CREATININE, CALCIUM,  in  the last 72 hours  Blood smear review: Normochromic normocytic red blood cells. There are no nucleated red cells. I see no target cells. Shows no schistocytes. White cell normal in morphology maturation. Platelets are adequate number size. She has a few large platelets." Well granulated.  Pathology: None     Assessment and Plan: Natalie Esparza is a very common 28 year old white female. This is her second pregnancy. Her first pregnancy ended in a miscarriage at 9 weeks. She now is at 33 or 34 weeks. Her due date is April 20.  She has von Willebrand's disease. We'll would have to think that this is type I or type IIA. I would think that it would be highly unlikely to be type IIB. Tabouli for type IIB, the platelets drop in number.  It is very unusual for a woman with von Willebrand's disease to require any therapy with pregnancy. It is very common for the von Willebrand and factor VIII levels to increase 2 or threefold. As such, intervention with von Willebrand's concentrate is unusual and typically on necessary.  She's had surgery before. She's had breast augmentation which is typically a bloody procedure. She had no problems with this.  Again we have to see what her von Willebrand levels are. It is recommended that they be maintained above 50 units. Also, her factor VIII level should be above 50 units.  I would not give DDAVP during pregnancy for the effect on the uterus with contractions.  It's possible, although unlikely, that she would need any von Willebrand therapy after delivery. It is typical for levels of von Willebrand and factor VIII to drop significantly after delivery.  I spent a good hour with her. I had a nice time with her. She is very eloquent.  I want to see her back about 4 months after her delivery. I told her that she has to bring her baby girl. At that point, we will check her von Willebrand levels, which should be baseline, and also do a hemachromatosis genetic assay.

## 2013-05-12 ENCOUNTER — Telehealth: Payer: Self-pay | Admitting: Hematology & Oncology

## 2013-05-12 NOTE — Telephone Encounter (Signed)
Left message with 7-31 appointment

## 2013-05-16 ENCOUNTER — Telehealth: Payer: Self-pay | Admitting: *Deleted

## 2013-05-16 LAB — VON WILLEBRAND PANEL
Coagulation Factor VIII: 190 % — ABNORMAL HIGH (ref 73–140)
RISTOCETIN CO-FACTOR, PLASMA: 187 % (ref 42–200)
Von Willebrand Antigen, Plasma: 198 % (ref 50–217)

## 2013-05-16 LAB — APTT: aPTT: 33 seconds (ref 24–37)

## 2013-05-16 NOTE — Telephone Encounter (Signed)
Gave patient below information from Dr. Myna HidalgoEnnever.  [facility]

## 2013-05-16 NOTE — Telephone Encounter (Signed)
Message copied by Anselm JunglingBARTKO, Chee Kinslow ELLEN O on Tue May 16, 2013  1:37 PM ------      Message from: Josph MachoENNEVER, PETER R      Created: Tue May 16, 2013  6:42 AM       Call - von willebrand levels are excellent!!!  Should have NO problems with delivery!!  Cindee LamePete  (PS  Make sure that the von willebrand labs get to her obstetrician!!) ------

## 2013-05-16 NOTE — Telephone Encounter (Signed)
Message copied by Anselm JunglingBARTKO, Darya Bigler ELLEN O on Tue May 16, 2013  1:36 PM ------      Message from: Josph MachoENNEVER, PETER R      Created: Tue May 16, 2013  6:42 AM       Call - von willebrand levels are excellent!!!  Should have NO problems with delivery!!  Cindee LamePete  (PS  Make sure that the von willebrand labs get to her obstetrician!!) ------

## 2013-05-25 ENCOUNTER — Ambulatory Visit (INDEPENDENT_AMBULATORY_CARE_PROVIDER_SITE_OTHER): Payer: 59 | Admitting: Obstetrics & Gynecology

## 2013-05-25 ENCOUNTER — Inpatient Hospital Stay (HOSPITAL_COMMUNITY)
Admission: AD | Admit: 2013-05-25 | Discharge: 2013-05-25 | Disposition: A | Discharge status: Home / Self Care | Payer: 59 | Source: Ambulatory Visit | Attending: Obstetrics & Gynecology | Admitting: Obstetrics & Gynecology

## 2013-05-25 ENCOUNTER — Encounter (HOSPITAL_COMMUNITY): Payer: Self-pay

## 2013-05-25 ENCOUNTER — Encounter: Payer: Self-pay | Admitting: *Deleted

## 2013-05-25 ENCOUNTER — Encounter: Payer: Self-pay | Admitting: Obstetrics & Gynecology

## 2013-05-25 VITALS — BP 122/75 | Wt 198.0 lb

## 2013-05-25 DIAGNOSIS — O0991 Supervision of high risk pregnancy, unspecified, first trimester: Secondary | ICD-10-CM

## 2013-05-25 DIAGNOSIS — O99119 Other diseases of the blood and blood-forming organs and certain disorders involving the immune mechanism complicating pregnancy, unspecified trimester: Secondary | ICD-10-CM

## 2013-05-25 DIAGNOSIS — R109 Unspecified abdominal pain: Secondary | ICD-10-CM | POA: Insufficient documentation

## 2013-05-25 DIAGNOSIS — O479 False labor, unspecified: Secondary | ICD-10-CM

## 2013-05-25 DIAGNOSIS — D6800 Von Willebrand disease, unspecified: Secondary | ICD-10-CM | POA: Insufficient documentation

## 2013-05-25 DIAGNOSIS — D689 Coagulation defect, unspecified: Secondary | ICD-10-CM | POA: Insufficient documentation

## 2013-05-25 DIAGNOSIS — O26859 Spotting complicating pregnancy, unspecified trimester: Secondary | ICD-10-CM | POA: Insufficient documentation

## 2013-05-25 DIAGNOSIS — O0990 Supervision of high risk pregnancy, unspecified, unspecified trimester: Secondary | ICD-10-CM

## 2013-05-25 DIAGNOSIS — O4700 False labor before 37 completed weeks of gestation, unspecified trimester: Secondary | ICD-10-CM | POA: Insufficient documentation

## 2013-05-25 LAB — URINALYSIS, ROUTINE W REFLEX MICROSCOPIC
Bilirubin Urine: NEGATIVE
GLUCOSE, UA: NEGATIVE mg/dL
Ketones, ur: NEGATIVE mg/dL
Nitrite: NEGATIVE
PROTEIN: NEGATIVE mg/dL
Urobilinogen, UA: 0.2 mg/dL (ref 0.0–1.0)
pH: 7 (ref 5.0–8.0)

## 2013-05-25 LAB — OB RESULTS CONSOLE GC/CHLAMYDIA
CHLAMYDIA, DNA PROBE: NEGATIVE
GC PROBE AMP, GENITAL: NEGATIVE

## 2013-05-25 LAB — OB RESULTS CONSOLE GBS: STREP GROUP B AG: POSITIVE

## 2013-05-25 LAB — URINE MICROSCOPIC-ADD ON

## 2013-05-25 NOTE — Progress Notes (Signed)
Routine visit. Good FM. Labor precautions reviewed. Cultures obtained today.

## 2013-05-25 NOTE — Addendum Note (Signed)
Addended by: Mariel AloeLARK, Beverly Ferner L on: 05/25/2013 04:00 PM   Modules accepted: Orders

## 2013-05-25 NOTE — MAU Provider Note (Signed)
First Provider Initiated Contact with Patient 05/25/13 2308      Chief Complaint:  Spotting and cramping  Natalie Esparza is  28 y.o. G2P0010 at [redacted]w[redacted]d presents complaining of brown spotting and cramping.  Also states that she has had a clear discharge for about a week.Marland Kitchen She was seen in clinic today and had a vag exam and was noted to be 2cms.   Obstetrical/Gynecological History: OB History   Grav Para Term Preterm Abortions TAB SAB Ect Mult Living   2 0   1  1        Past Medical History: Past Medical History  Diagnosis Date  . Von Willebrand's disease   . Hemochromatosis   . Anemia     Past Surgical History: Past Surgical History  Procedure Laterality Date  . Arm pit surgery      cleaned out sinus  . Breast enhancement surgery      Family History: Family History  Problem Relation Age of Onset  . Diabetes Maternal Grandfather   . Heart attack Maternal Grandfather   . Hypertension Mother   . Cancer Maternal Grandfather     Social History: History  Substance Use Topics  . Smoking status: Never Smoker   . Smokeless tobacco: Never Used     Comment: never used tobacco  . Alcohol Use: No     Comment: glass of wine every other day  LAST TIME  10-09-2012    Allergies:  Allergies  Allergen Reactions  . Aspirin Other (See Comments)    Can not take it     Meds:  Prescriptions prior to admission  Medication Sig Dispense Refill  . Docosahexaenoic Acid (DHA PO) Take by mouth.      . Prenatal Vit-Fe Fumarate-FA (PRENATABS FA) TABS Take 1 tablet by mouth 1 day or 1 dose.  30 each  3    Review of Systems   Constitutional: Negative for fever and chills Eyes: Negative for visual disturbances Respiratory: Negative for shortness of breath, dyspnea Cardiovascular: Negative for chest pain or palpitations  Gastrointestinal: Negative for vomiting, diarrhea and constipation Genitourinary: Negative for dysuria and urgency Musculoskeletal: Negative for back pain, joint pain,  myalgias  Neurological: Negative for dizziness and headaches     Physical Exam  Blood pressure 114/71, pulse 83, temperature 99 F (37.2 C), resp. rate 18, height 5\' 8"  (1.727 m), weight 90.629 kg (199 lb 12.8 oz), last menstrual period 09/08/2012, SpO2 100.00%. GENERAL: Well-developed, well-nourished female in no acute distress.  LUNGS: Clear to auscultation bilaterally.  HEART: Regular rate and rhythm. ABDOMEN: Soft, nontender, nondistended, gravid.  EXTREMITIES: Nontender, no edema, 2+ distal pulses. DTR's 2+ SSE:  Scant brown discharge c/w old blood.  No pooling, negative valsalva, fern CERVICAL EXAM: Dilatation 2cm   Effacement 50%   Station -2   Presentation: cephalic FHT:  Baseline rate 140 bpm   Variability moderate  Accelerations present   Decelerations none Contractions: Had one contraction  Labs: Results for orders placed during the hospital encounter of 05/25/13 (from the past 24 hour(s))  URINALYSIS, ROUTINE W REFLEX MICROSCOPIC   Collection Time    05/25/13 10:21 PM      Result Value Ref Range   Color, Urine YELLOW  YELLOW   APPearance CLEAR  CLEAR   Specific Gravity, Urine <1.005 (*) 1.005 - 1.030   pH 7.0  5.0 - 8.0   Glucose, UA NEGATIVE  NEGATIVE mg/dL   Hgb urine dipstick TRACE (*) NEGATIVE   Bilirubin Urine NEGATIVE  NEGATIVE   Ketones, ur NEGATIVE  NEGATIVE mg/dL   Protein, ur NEGATIVE  NEGATIVE mg/dL   Urobilinogen, UA 0.2  0.0 - 1.0 mg/dL   Nitrite NEGATIVE  NEGATIVE   Leukocytes, UA SMALL (*) NEGATIVE  URINE MICROSCOPIC-ADD ON   Collection Time    05/25/13 10:21 PM      Result Value Ref Range   Squamous Epithelial / LPF RARE  RARE   WBC, UA 0-2  <3 WBC/hpf   RBC / HPF 0-2  <3 RBC/hpf   Bacteria, UA RARE  RARE   Imaging Studies:  No results found.  Assessment: Natalie Esparza is  28 y.o. G2P0010 at 6330w2d presents with Deberah PeltonBraxton Hicks, spotting s/p vag exam.  Plan: D/C home with labor precautions  CRESENZO-DISHMAN,Shakeria Robinette 3/26/201511:16  PM

## 2013-05-25 NOTE — MAU Note (Signed)
Lower abdominal cramping since 4pm today. Some spotting today, had cervical exam in office at 330 pm. Dilated 2 cm. Denies LOF. Positive fetal movement. Denies dysuria.

## 2013-05-25 NOTE — Discharge Instructions (Signed)

## 2013-05-25 NOTE — Progress Notes (Signed)
p-88 

## 2013-05-26 ENCOUNTER — Encounter: Payer: 59 | Admitting: Advanced Practice Midwife

## 2013-05-26 LAB — GC/CHLAMYDIA PROBE AMP
CT Probe RNA: NEGATIVE
GC Probe RNA: NEGATIVE

## 2013-05-27 LAB — CULTURE, BETA STREP (GROUP B ONLY)

## 2013-05-31 ENCOUNTER — Encounter: Payer: Self-pay | Admitting: *Deleted

## 2013-05-31 ENCOUNTER — Encounter: Payer: Self-pay | Admitting: Advanced Practice Midwife

## 2013-05-31 ENCOUNTER — Ambulatory Visit (INDEPENDENT_AMBULATORY_CARE_PROVIDER_SITE_OTHER): Payer: 59 | Admitting: Advanced Practice Midwife

## 2013-05-31 ENCOUNTER — Encounter: Payer: Self-pay | Admitting: Obstetrics & Gynecology

## 2013-05-31 VITALS — BP 115/79 | Wt 200.0 lb

## 2013-05-31 DIAGNOSIS — O9989 Other specified diseases and conditions complicating pregnancy, childbirth and the puerperium: Secondary | ICD-10-CM

## 2013-05-31 DIAGNOSIS — M549 Dorsalgia, unspecified: Secondary | ICD-10-CM

## 2013-05-31 DIAGNOSIS — O99891 Other specified diseases and conditions complicating pregnancy: Secondary | ICD-10-CM

## 2013-05-31 DIAGNOSIS — Z3480 Encounter for supervision of other normal pregnancy, unspecified trimester: Secondary | ICD-10-CM

## 2013-05-31 MED ORDER — CYCLOBENZAPRINE HCL 10 MG PO TABS: 5.0000 mg | ORAL_TABLET | Freq: Three times a day (TID) | ORAL | Status: DC | PRN

## 2013-05-31 NOTE — Progress Notes (Signed)
p-83  Increased in contractions and very low back pain.

## 2013-05-31 NOTE — Progress Notes (Signed)
Good fetal movement, denies vaginal bleeding, LOF, regular contractions.  Reports sudden back pain intermittently when walking, "feels like my back is locking up and I can't move."  Desires cervical exam today because of intermittent cramping.  Pt appears uncomfortable and has difficulty adjusting position on her back for cervical exam.  Flexeril 5-10 mg TID PRN for back spasms.  Reviewed normal Vonwillebrand labs done with hematology.  Pt has plan for f/u with hematology postpartum. Consent for waterbirth signed.  Pt declined doppler in early pregnancy but agrees to doppler today.  Discussed need for monitoring at pregnancy visits and in labor.  Pt reports she did not want doppler in early pregnancy but is Ok with it now.  Pt agrees to intermittent monitoring per policy in labor, and continuous monitoring if required during labor.  She also understands that plans can change and she may need to get out of the tub for medical reasons.

## 2013-06-01 ENCOUNTER — Encounter: Payer: 59 | Admitting: Obstetrics & Gynecology

## 2013-06-02 ENCOUNTER — Encounter (HOSPITAL_COMMUNITY): Payer: Self-pay | Admitting: *Deleted

## 2013-06-02 ENCOUNTER — Inpatient Hospital Stay (HOSPITAL_COMMUNITY)
Admission: AD | Admit: 2013-06-02 | Discharge: 2013-06-04 | DRG: 775 | Disposition: A | Discharge status: Home / Self Care | Payer: BC Managed Care – PPO | Source: Ambulatory Visit | Attending: Family Medicine | Admitting: Family Medicine

## 2013-06-02 DIAGNOSIS — O9989 Other specified diseases and conditions complicating pregnancy, childbirth and the puerperium: Secondary | ICD-10-CM

## 2013-06-02 DIAGNOSIS — D6800 Von Willebrand disease, unspecified: Secondary | ICD-10-CM | POA: Diagnosis present

## 2013-06-02 DIAGNOSIS — O99892 Other specified diseases and conditions complicating childbirth: Secondary | ICD-10-CM | POA: Diagnosis present

## 2013-06-02 DIAGNOSIS — Z22330 Carrier of Group B streptococcus: Secondary | ICD-10-CM

## 2013-06-02 DIAGNOSIS — D649 Anemia, unspecified: Secondary | ICD-10-CM | POA: Diagnosis present

## 2013-06-02 DIAGNOSIS — O9902 Anemia complicating childbirth: Secondary | ICD-10-CM | POA: Diagnosis present

## 2013-06-02 DIAGNOSIS — IMO0001 Reserved for inherently not codable concepts without codable children: Secondary | ICD-10-CM

## 2013-06-02 DIAGNOSIS — D689 Coagulation defect, unspecified: Secondary | ICD-10-CM | POA: Diagnosis present

## 2013-06-02 DIAGNOSIS — O9912 Other diseases of the blood and blood-forming organs and certain disorders involving the immune mechanism complicating childbirth: Secondary | ICD-10-CM

## 2013-06-02 MED ORDER — IBUPROFEN 600 MG PO TABS: 600.0000 mg | ORAL_TABLET | Freq: Four times a day (QID) | ORAL | Status: DC | PRN

## 2013-06-02 MED ORDER — DEXTROSE 5 % IV SOLN
5.0000 10*6.[IU] | Freq: Once | INTRAVENOUS | Status: AC
  Administered 2013-06-03: 5 10*6.[IU] via INTRAVENOUS
  Filled 2013-06-02: qty 5

## 2013-06-02 MED ORDER — PENICILLIN G POTASSIUM 5000000 UNITS IJ SOLR
2.5000 10*6.[IU] | INTRAVENOUS | Status: DC
  Administered 2013-06-03 (×3): 2.5 10*6.[IU] via INTRAVENOUS
  Filled 2013-06-02 (×7): qty 2.5

## 2013-06-02 MED ORDER — LIDOCAINE HCL (PF) 1 % IJ SOLN
30.0000 mL | INTRAMUSCULAR | Status: DC | PRN
  Administered 2013-06-03: 30 mL via SUBCUTANEOUS
  Filled 2013-06-02: qty 30

## 2013-06-02 MED ORDER — OXYTOCIN 40 UNITS IN LACTATED RINGERS INFUSION - SIMPLE MED: 62.5000 mL/h | INTRAVENOUS | Status: DC

## 2013-06-02 MED ORDER — ACETAMINOPHEN 325 MG PO TABS: 650.0000 mg | ORAL_TABLET | ORAL | Status: DC | PRN

## 2013-06-02 MED ORDER — OXYCODONE-ACETAMINOPHEN 5-325 MG PO TABS: 1.0000 | ORAL_TABLET | ORAL | Status: DC | PRN

## 2013-06-02 MED ORDER — CITRIC ACID-SODIUM CITRATE 334-500 MG/5ML PO SOLN
30.0000 mL | ORAL | Status: DC | PRN
  Administered 2013-06-03 (×2): 30 mL via ORAL
  Filled 2013-06-02 (×2): qty 15

## 2013-06-02 MED ORDER — LACTATED RINGERS IV SOLN
INTRAVENOUS | Status: DC
  Administered 2013-06-03 (×2): via INTRAVENOUS

## 2013-06-02 MED ORDER — LACTATED RINGERS IV SOLN: 500.0000 mL | INTRAVENOUS | Status: DC | PRN

## 2013-06-02 MED ORDER — ONDANSETRON HCL 4 MG/2ML IJ SOLN
4.0000 mg | Freq: Four times a day (QID) | INTRAMUSCULAR | Status: DC | PRN
  Administered 2013-06-03: 4 mg via INTRAVENOUS
  Filled 2013-06-02: qty 2

## 2013-06-02 MED ORDER — OXYTOCIN BOLUS FROM INFUSION: 500.0000 mL | INTRAVENOUS | Status: DC

## 2013-06-02 NOTE — Progress Notes (Signed)
[facility] via [facility]c from T[facility]. Plans water birth.

## 2013-06-02 NOTE — H&P (Signed)
Natalie Esparza is a 28 y.o. female G2P0010 at 752w3d presenting with gross ROM.  Patient reports that she stood up tonight (1030 PM) and had a sudden gush of clear fluid.  She then came immediately to the MAU for evaluation and was sent directly to L&D.  She reports that she has been having contractions intermittently.  They were q6 min last night.  No vaginal bleeding.  + Fetal movement.  No other complaints currently.  PNC: KV; Hx of VWD.  Normal 1 hour GTT. Desires water birth.   History OB History   Grav Para Term Preterm Abortions TAB SAB Ect Mult Living   2 0   1  1        Past Medical History  Diagnosis Date  . Von Willebrand's disease   . Hemochromatosis   . Anemia    Past Surgical History  Procedure Laterality Date  . Arm pit surgery      cleaned out sinus  . Breast enhancement surgery     Family History: family history includes Cancer in her maternal grandfather; Diabetes in her maternal grandfather; Heart attack in her maternal grandfather; Hypertension in her mother. Social History:  reports that she has never smoked. She has never used smokeless tobacco. She reports that she does not drink alcohol or use illicit drugs.   Prenatal Transfer Tool  Maternal Diabetes: No Genetic Screening: CF negative; Declined further screening.  Maternal Ultrasounds/Referrals: Normal Fetal Ultrasounds or other Referrals:  None Maternal Substance Abuse:  No Significant Maternal Medications:  None Significant Maternal Lab Results:  GBS positive.   ROS Per HPI   Blood pressure 117/77, pulse 104, resp. rate 20, height 5\' 8"  (1.727 m), weight 91.899 kg (202 lb 9.6 oz), last menstrual period 09/08/2012. Exam Physical Exam  Gen: well appearing female in NAD.  Heart: RRR Lungs: CTAB, NWOB Abd: gravid but otherwise soft, nontender to palpation Ext: no appreciable lower extremity edema bilaterally Dilation: 3.5 Effacement (%): 80 Station: -2 Presentation: Vertex Exam by:: C.  Blackstock, RN  FHR: baseline 150, mod variability, 15x15 accels, no decels Toco: Q2-3 min  Fern positive  Prenatal labs: ABO, Rh: A/POS/-- (08/27 1428) Antibody: NEG (08/27 1428) Rubella: 3.37 (08/27 1428) RPR: NON REAC (01/30 1055)  HBsAg: NEGATIVE (08/27 1428)  HIV: NON REACTIVE (01/30 1055)  GBS: Positive (03/26 0000)   Assessment/Plan: 28 y.o. female G2P0010 at 6252w3d presenting with gross ROM. Fern Positive.  #Labor: Planning water birth, expectant management #Pain: No medications desired currently #FWB:  Cat 1 tracing #ID: GBS +; Treating with PCN  Natalie Esparza, Natalie Esparza 06/02/2013, 11:14 PM  I have seen and examined this patient and I agree with the above. Cam HaiSHAW, Doral Ventrella 7:41 AM 06/03/2013

## 2013-06-02 NOTE — MAU Note (Signed)
Leaking clear fld since 2200. Some contractions.

## 2013-06-03 ENCOUNTER — Encounter (HOSPITAL_COMMUNITY): Payer: BC Managed Care – PPO | Admitting: Anesthesiology

## 2013-06-03 ENCOUNTER — Inpatient Hospital Stay (HOSPITAL_COMMUNITY): Payer: BC Managed Care – PPO | Admitting: Anesthesiology

## 2013-06-03 ENCOUNTER — Encounter (HOSPITAL_COMMUNITY): Payer: Self-pay | Admitting: *Deleted

## 2013-06-03 DIAGNOSIS — D6800 Von Willebrand disease, unspecified: Secondary | ICD-10-CM

## 2013-06-03 DIAGNOSIS — O9912 Other diseases of the blood and blood-forming organs and certain disorders involving the immune mechanism complicating childbirth: Secondary | ICD-10-CM

## 2013-06-03 DIAGNOSIS — D689 Coagulation defect, unspecified: Secondary | ICD-10-CM

## 2013-06-03 DIAGNOSIS — O9902 Anemia complicating childbirth: Secondary | ICD-10-CM

## 2013-06-03 LAB — CBC
HCT: 30.4 % — ABNORMAL LOW (ref 36.0–46.0)
HEMATOCRIT: 26.9 % — AB (ref 36.0–46.0)
HEMOGLOBIN: 10.4 g/dL — AB (ref 12.0–15.0)
Hemoglobin: 9.2 g/dL — ABNORMAL LOW (ref 12.0–15.0)
MCH: 30.3 pg (ref 26.0–34.0)
MCH: 30.4 pg (ref 26.0–34.0)
MCHC: 34.2 g/dL (ref 30.0–36.0)
MCHC: 34.2 g/dL (ref 30.0–36.0)
MCV: 88.5 fL (ref 78.0–100.0)
MCV: 88.9 fL (ref 78.0–100.0)
Platelets: 255 10*3/uL (ref 150–400)
Platelets: 319 10*3/uL (ref 150–400)
RBC: 3.04 MIL/uL — ABNORMAL LOW (ref 3.87–5.11)
RBC: 3.42 MIL/uL — ABNORMAL LOW (ref 3.87–5.11)
RDW: 13 % (ref 11.5–15.5)
RDW: 13.2 % (ref 11.5–15.5)
WBC: 14.4 10*3/uL — ABNORMAL HIGH (ref 4.0–10.5)
WBC: 17.8 10*3/uL — AB (ref 4.0–10.5)

## 2013-06-03 LAB — TYPE AND SCREEN
ABO/RH(D): A POS
Antibody Screen: NEGATIVE

## 2013-06-03 LAB — RPR: RPR: NONREACTIVE

## 2013-06-03 LAB — ABO/RH: ABO/RH(D): A POS

## 2013-06-03 MED ORDER — SIMETHICONE 80 MG PO CHEW: 80.0000 mg | CHEWABLE_TABLET | ORAL | Status: DC | PRN

## 2013-06-03 MED ORDER — LIDOCAINE HCL (PF) 1 % IJ SOLN
INTRAMUSCULAR | Status: DC | PRN
  Administered 2013-06-03 (×4): 4 mL

## 2013-06-03 MED ORDER — ONDANSETRON HCL 4 MG PO TABS: 4.0000 mg | ORAL_TABLET | ORAL | Status: DC | PRN

## 2013-06-03 MED ORDER — MISOPROSTOL 200 MCG PO TABS
ORAL_TABLET | ORAL | Status: AC
  Filled 2013-06-03: qty 4

## 2013-06-03 MED ORDER — OXYCODONE-ACETAMINOPHEN 5-325 MG PO TABS
1.0000 | ORAL_TABLET | ORAL | Status: DC | PRN
  Administered 2013-06-04 (×2): 1 via ORAL
  Filled 2013-06-03 (×2): qty 1

## 2013-06-03 MED ORDER — EPHEDRINE 5 MG/ML INJ
10.0000 mg | INTRAVENOUS | Status: DC | PRN
  Filled 2013-06-03: qty 2
  Filled 2013-06-03: qty 4

## 2013-06-03 MED ORDER — PRENATAL MULTIVITAMIN CH
1.0000 | ORAL_TABLET | Freq: Every day | ORAL | Status: DC
  Administered 2013-06-04: 1 via ORAL
  Filled 2013-06-03: qty 1

## 2013-06-03 MED ORDER — DIPHENHYDRAMINE HCL 50 MG/ML IJ SOLN: 12.5000 mg | INTRAMUSCULAR | Status: DC | PRN

## 2013-06-03 MED ORDER — BENZOCAINE-MENTHOL 20-0.5 % EX AERO
1.0000 "application " | INHALATION_SPRAY | CUTANEOUS | Status: DC | PRN
  Administered 2013-06-04: 1 via TOPICAL
  Filled 2013-06-03: qty 56

## 2013-06-03 MED ORDER — WITCH HAZEL-GLYCERIN EX PADS
1.0000 "application " | MEDICATED_PAD | CUTANEOUS | Status: DC | PRN
  Administered 2013-06-04: 1 via TOPICAL

## 2013-06-03 MED ORDER — OXYTOCIN 40 UNITS IN LACTATED RINGERS INFUSION - SIMPLE MED
1.0000 m[IU]/min | INTRAVENOUS | Status: DC
  Administered 2013-06-03: 2 m[IU]/min via INTRAVENOUS
  Administered 2013-06-03: 4 m[IU]/min via INTRAVENOUS
  Filled 2013-06-03: qty 1000

## 2013-06-03 MED ORDER — LACTATED RINGERS IV SOLN
500.0000 mL | Freq: Once | INTRAVENOUS | Status: AC
  Administered 2013-06-03: 500 mL via INTRAVENOUS

## 2013-06-03 MED ORDER — PHENYLEPHRINE 40 MCG/ML (10ML) SYRINGE FOR IV PUSH (FOR BLOOD PRESSURE SUPPORT)
80.0000 ug | PREFILLED_SYRINGE | INTRAVENOUS | Status: DC | PRN
  Filled 2013-06-03: qty 2

## 2013-06-03 MED ORDER — EPHEDRINE 5 MG/ML INJ
10.0000 mg | INTRAVENOUS | Status: DC | PRN
  Filled 2013-06-03: qty 2

## 2013-06-03 MED ORDER — PHENYLEPHRINE 40 MCG/ML (10ML) SYRINGE FOR IV PUSH (FOR BLOOD PRESSURE SUPPORT)
80.0000 ug | PREFILLED_SYRINGE | INTRAVENOUS | Status: DC | PRN
  Filled 2013-06-03: qty 10
  Filled 2013-06-03: qty 2

## 2013-06-03 MED ORDER — MISOPROSTOL 200 MCG PO TABS
800.0000 ug | ORAL_TABLET | Freq: Once | ORAL | Status: AC
  Administered 2013-06-03: 800 ug via VAGINAL

## 2013-06-03 MED ORDER — LANOLIN HYDROUS EX OINT: TOPICAL_OINTMENT | CUTANEOUS | Status: DC | PRN

## 2013-06-03 MED ORDER — TERBUTALINE SULFATE 1 MG/ML IJ SOLN: 0.2500 mg | Freq: Once | INTRAMUSCULAR | Status: DC | PRN

## 2013-06-03 MED ORDER — SENNOSIDES-DOCUSATE SODIUM 8.6-50 MG PO TABS
2.0000 | ORAL_TABLET | ORAL | Status: DC
  Administered 2013-06-04: 2 via ORAL
  Filled 2013-06-03: qty 2

## 2013-06-03 MED ORDER — ZOLPIDEM TARTRATE 5 MG PO TABS: 5.0000 mg | ORAL_TABLET | Freq: Every evening | ORAL | Status: DC | PRN

## 2013-06-03 MED ORDER — DIPHENHYDRAMINE HCL 25 MG PO CAPS: 25.0000 mg | ORAL_CAPSULE | Freq: Four times a day (QID) | ORAL | Status: DC | PRN

## 2013-06-03 MED ORDER — FENTANYL 2.5 MCG/ML BUPIVACAINE 1/10 % EPIDURAL INFUSION (WH - ANES)
14.0000 mL/h | INTRAMUSCULAR | Status: DC | PRN
  Administered 2013-06-03 (×2): 14 mL/h via EPIDURAL
  Filled 2013-06-03 (×2): qty 125

## 2013-06-03 MED ORDER — TETANUS-DIPHTH-ACELL PERTUSSIS 5-2.5-18.5 LF-MCG/0.5 IM SUSP: 0.5000 mL | Freq: Once | INTRAMUSCULAR | Status: DC

## 2013-06-03 MED ORDER — IBUPROFEN 600 MG PO TABS
600.0000 mg | ORAL_TABLET | Freq: Four times a day (QID) | ORAL | Status: DC
  Filled 2013-06-03: qty 1

## 2013-06-03 MED ORDER — ONDANSETRON HCL 4 MG/2ML IJ SOLN: 4.0000 mg | INTRAMUSCULAR | Status: DC | PRN

## 2013-06-03 MED ORDER — FENTANYL CITRATE 0.05 MG/ML IJ SOLN
100.0000 ug | INTRAMUSCULAR | Status: DC | PRN
  Administered 2013-06-03 (×2): 100 ug via INTRAVENOUS
  Filled 2013-06-03 (×2): qty 2

## 2013-06-03 MED ORDER — DIBUCAINE 1 % RE OINT
1.0000 "application " | TOPICAL_OINTMENT | RECTAL | Status: DC | PRN
  Administered 2013-06-04: 1 via RECTAL
  Filled 2013-06-03: qty 28

## 2013-06-03 NOTE — Progress Notes (Signed)
I spoke with and examined patient and agree with resident/PA/SNM's note and plan of care.  No change in cx, so will initiate pitocin per protocol.  Cheral MarkerKimberly R. Willine Schwalbe, CNM, [facility] 06/03/2013 12:26 PM

## 2013-06-03 NOTE — Progress Notes (Signed)
Patient ID: Natalie Esparza, female   DOB: 08/05/1985, 28 y.o.   MRN: [id number] Natalie Esparza is a 28 y.o. G2P0010 at 2421w4d  admitted for rupture of membranes  Subjective: Feeling pressure/urge to push  Objective: BP 112/78  Pulse 98  Temp(Src) 99 F (37.2 C) (Oral)  Resp 18  Ht 5\' 8"  (1.727 m)  Wt 91.899 kg (202 lb 9.6 oz)  BMI 30.81 kg/m2  SpO2 100%  LMP 09/08/2012 Total I/O In: -  Out: 400 [Urine:400]  FHT:  FHR: 140 bpm, variability: moderate,  accelerations:  Present,  decelerations:  Absent UC:   regular, every 1-4 minutes  SVE:   Dilation: 7 Effacement (%): 90 Station: 0 Exam by:: Doctor, hospitalmith RN  Pitocin @ 8 mu/min  Labs: Lab Results  Component Value Date   WBC 14.4* 06/03/2013   HGB 10.4* 06/03/2013   HCT 30.4* 06/03/2013   MCV 88.9 06/03/2013   PLT 319 06/03/2013    Assessment / Plan: Augmentation of labor, progressing well  Labor: Progressing normally Fetal Wellbeing:  Category I Pain Control:  Epidural Pre-eclampsia: n/a I/D:  PCN for gbs+ Anticipated MOD:  NSVD  Marge DuncansBooker, Natalie Esparza CNM, WHNP-BC 06/03/2013, 12:56 PM

## 2013-06-03 NOTE — Progress Notes (Signed)
Natalie Esparza is a 28 y.o. G2P0010 at 2165w4d admitted for SROM  Subjective: Patient comfortable with epidural.  Objective: BP 107/66  Pulse 98  Temp(Src) 98.6 F (37 C) (Oral)  Resp 18  Ht 5\' 8"  (1.727 m)  Wt 91.899 kg (202 lb 9.6 oz)  BMI 30.81 kg/m2  SpO2 100%  LMP 09/08/2012      FHT:  FHR: 140s bpm, variability: moderate,  accelerations:  Present,  decelerations:  Absent UC:   irregular, every 3-5 minutes SVE:   Dilation: 5 Effacement (%): 80 Station: -1 Exam by:: Inetta Fermoina- exam deferred at present  Labs: Lab Results  Component Value Date   WBC 14.4* 06/03/2013   HGB 10.4* 06/03/2013   HCT 30.4* 06/03/2013   MCV 88.9 06/03/2013   PLT 319 06/03/2013    Assessment / Plan: IUP at term Epidural Augment labor with pitocin  Natalie Esparza, Natalie Esparza 06/03/2013, 9:46 AM

## 2013-06-03 NOTE — Progress Notes (Signed)
Natalie Esparza is a 28 y.o. G2P0010 at 3835w4d admitted for SROM  Subjective: Patient is feeling pressure.  Objective: BP 115/77  Pulse 105  Temp(Src) 99 F (37.2 C) (Oral)  Resp 18  Ht 5\' 8"  (1.727 m)  Wt 91.899 kg (202 lb 9.6 oz)  BMI 30.81 kg/m2  SpO2 100%  LMP 09/08/2012   Total I/O In: -  Out: 400 [Urine:400]  FHT:  FHR: 135 bpm, variability: moderate,  accelerations:  Present,  decelerations:  Absent UC:   irregular, every 3-5 minutes SVE:   Dilation: 10 Effacement (%): 100 Station: +1 Exam by:: Doctor, hospitalmith RN- exam deferred at present  Labs: Lab Results  Component Value Date   WBC 14.4* 06/03/2013   HGB 10.4* 06/03/2013   HCT 30.4* 06/03/2013   MCV 88.9 06/03/2013   PLT 319 06/03/2013    Assessment / Plan: Patient is complete with the urge to push.  Did a few trial pushes and patient was not moving the baby down. Will allow patient to labor down before delivery.  Natalie Esparza, Natalie Esparza 06/03/2013, 2:18 PM

## 2013-06-03 NOTE — Progress Notes (Signed)
I spoke with and examined patient and agree with resident/PA/SNM's note and plan of care.  Cheral MarkerKimberly R. Booker, CNM, [facility] 06/03/2013 2:39 PM

## 2013-06-03 NOTE — Progress Notes (Signed)
Nil BelarusSpain is a 28 y.o. G2P0010 at 1435w4d   Subjective: Up to bathroom; decision for epidural  Objective: BP 111/80  Pulse 116  Temp(Src) 98.6 F (37 C) (Oral)  Resp 20  Ht 5\' 8"  (1.727 m)  Wt 91.899 kg (202 lb 9.6 oz)  BMI 30.81 kg/m2  SpO2 100%  LMP 09/08/2012      FHT:  FHR: 140s bpm, variability: moderate,  accelerations:  Present,  decelerations:  Absent UC:   irregular, every 3-5 minutes SVE:   Dilation: 5 Effacement (%): 80 Station: -2 Exam by:: Lilli Few. Blackstock, RN- exam deferred at present  Labs: Lab Results  Component Value Date   WBC 14.4* 06/03/2013   HGB 10.4* 06/03/2013   HCT 30.4* 06/03/2013   MCV 88.9 06/03/2013   PLT 319 06/03/2013    Assessment / Plan: IUP at term Early active labor  Will have epidural placed  Mayetta Castleman 06/03/2013, 8:40 AM

## 2013-06-03 NOTE — Anesthesia Procedure Notes (Signed)
Epidural Patient location during procedure: OB Start time: 06/03/2013 8:31 AM  Staffing Performed by: anesthesiologist   Preanesthetic Checklist Completed: patient identified, site marked, surgical consent, pre-op evaluation, timeout performed, IV checked, risks and benefits discussed and monitors and equipment checked  Epidural Patient position: sitting Prep: site prepped and draped and DuraPrep Patient monitoring: continuous pulse ox and blood pressure Approach: midline Injection technique: LOR air  Needle:  Needle type: Tuohy  Needle gauge: 17 G Needle length: 9 cm and 9 Needle insertion depth: 6.5 cm Catheter type: closed end flexible Catheter size: 19 Gauge Catheter at skin depth: 11.5 cm Test dose: negative  Assessment Events: blood not aspirated, injection not painful, no injection resistance, negative IV test and no paresthesia  Additional Notes Discussed risk of headache, infection, bleeding, nerve injury and failed or incomplete block.  Patient voices understanding and wishes to proceed.  Epidural placed easily on first attempt.  No paresthesia.  Patient tolerated procedure well with no apparent complications.  Jasmine DecemberA. Zeola Brys, MDReason for block:procedure for pain

## 2013-06-03 NOTE — Anesthesia Preprocedure Evaluation (Signed)
Anesthesia Evaluation  Patient identified by MRN, date of birth, ID band Patient awake    Reviewed: Allergy & Precautions, H&P , NPO status , Patient's Chart, lab work & pertinent test results, reviewed documented beta blocker date and time   History of Anesthesia Complications Negative for: history of anesthetic complications  Airway Mallampati: II TM Distance: >3 FB Neck ROM: full    Dental  (+) Teeth Intact   Pulmonary neg pulmonary ROS,  breath sounds clear to auscultation        Cardiovascular negative cardio ROS  Rhythm:regular Rate:Normal     Neuro/Psych negative neurological ROS  negative psych ROS   GI/Hepatic negative GI ROS, Neg liver ROS,   Endo/Other  negative endocrine ROS  Renal/GU negative Renal ROS     Musculoskeletal   Abdominal   Peds  Hematology  (+) Blood dyscrasia (von Willebrand's Disease - saw hematology 05/11/13, vWF was high, 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  Anticipated no problems with deilvery.  At that time, pt planned water birth, now requested epidural ), anemia , hemochromatosis   Anesthesia Other Findings   Reproductive/Obstetrics (+) Pregnancy                           Anesthesia Physical Anesthesia Plan  ASA: II  Anesthesia Plan: Epidural   Post-op Pain Management:    Induction:   Airway Management Planned:   Additional Equipment:   Intra-op Plan:   Post-operative Plan:   Informed Consent: I have reviewed the patients History and Physical, chart, labs and discussed the procedure including the risks, benefits and alternatives for the proposed anesthesia with the patient or authorized representative who has indicated his/her understanding and acceptance.     Plan Discussed with:   Anesthesia Plan Comments:         Anesthesia Quick Evaluation

## 2013-06-04 ENCOUNTER — Ambulatory Visit: Payer: Self-pay

## 2013-06-04 NOTE — Anesthesia Postprocedure Evaluation (Signed)
Anesthesia Post Note  Patient: Natalie Esparza  Procedure(s) Performed: * No procedures listed *  Anesthesia type: Epidural  Patient location: Mother/Baby  Post pain: Pain level controlled  Post assessment: Post-op Vital signs reviewed  Last Vitals:  Filed Vitals:   06/04/13 0700  BP: 99/65  Pulse: 74  Temp: 36.8 C  Resp: 20    Post vital signs: Reviewed  Level of consciousness:alert  Complications: No apparent anesthesia complications

## 2013-06-04 NOTE — Discharge Summary (Signed)
Obstetric Discharge Summary Reason for Admission: onset of labor and rupture of membranes Prenatal Procedures: ultrasound Intrapartum Procedures: spontaneous vaginal delivery and GBS prophylaxis Postpartum Procedures: none Complications-Operative and Postpartum: 1st degree bilateral labial repaired Eating, drinking, voiding, ambulating well.  +flatus.  Lochia and pain wnl.  Denies dizziness, lightheadedness, or sob. No complaints. Desires early d/c HGB  Date Value Ref Range Status  05/11/2013 10.4* 11.6 - 15.9 g/dL Final     Hemoglobin  Date Value Ref Range Status  06/03/2013 9.2* 12.0 - 15.0 g/dL Final     HCT  Date Value Ref Range Status  06/03/2013 26.9* 36.0 - 46.0 % Final  05/11/2013 31.5* 34.8 - 46.6 % Final    Physical Exam:  General: alert, cooperative and no distress Lochia: appropriate Uterine Fundus: firm Incision: n/a DVT Evaluation: No evidence of DVT seen on physical exam. Negative Homan's sign. No cords or calf tenderness. No significant calf/ankle edema.  Discharge Diagnoses: Term Pregnancy-delivered , h/o stable Von Willebrand disease w/o peripartum complication  Discharge Information: Date: 06/04/2013 Activity: pelvic rest Diet: routine Medications: PNV and Percocet d/t unable to take nsaids d/t VW Condition: stable Instructions: refer to practice specific booklet Discharge to: home Follow-up Information   Schedule an appointment as soon as possible for a [facility] [place]. (4-6 weeks for your postpartum visit)    Specialty:  Obstetrics and Gynecology   Contact information:   [address], [address] [place] [address] [phone]      Newborn Data: Live born female  Birth Weight: 6 lb 6.5 oz (2905 g) APGAR: 7, 8  Home with mother. Breastfeeding Undecided about contraception, discussed options- would like to think about it some more   Marge DuncansBooker, Tulio Facundo Randall 06/04/2013, 7:43 AM

## 2013-06-04 NOTE — Lactation Note (Signed)
This note was copied from the chart of Natalie Esparza. Lactation Consultation Note  Patient Name: Natalie Esparza Today's Date: 06/04/2013 Reason for consult: Follow-up assessment;Difficult latch Called to assist Mom with latching baby but when arrived Mom had baby latched in cross cradle using #16 nipple shield. Mom denies discomfort, baby appeared to have good latch. Colostrum visible in the nipple shield. Mom is not d/c tonight, set up DEBP and encouraged Mom to post pump with feedings in case she has struggles with latch we can work on bringing her milk in. Also encouraged Mom if she receives any EBM with pumping to give this back to baby till we can see her tomorrow and till baby is latching consistently. Mom was very pleased baby was latched and she did not have discomfort. Mom is to pump after feedings for 15 minutes on preemie setting. Advised to call for assist as needed.   Maternal Data    Feeding Feeding Type: Breast Fed Length of feed: 10 min  LATCH Score/Interventions Latch: Repeated attempts needed to sustain latch, nipple held in mouth throughout feeding, stimulation needed to elicit sucking reflex. (using #16 nipple shield) Intervention(s): Adjust position;Assist with latch;Breast massage;Breast compression  Audible Swallowing: A few with stimulation  Type of Nipple: Everted at rest and after stimulation (short nipple shaft) Intervention(s): Hand pump  Comfort (Breast/Nipple): Filling, red/small blisters or bruises, mild/mod discomfort  Problem noted: Mild/Moderate discomfort Interventions (Mild/moderate discomfort): Comfort gels;Hand expression  Hold (Positioning): No assistance needed to correctly position infant at breast. Intervention(s): Breastfeeding basics reviewed;Support Pillows;Position options;Skin to skin  LATCH Score: 7  Lactation Tools Discussed/Used Tools: Nipple Shields;Pump;Comfort gels Nipple shield size: 20;16 Breast pump type:  Double-Electric Breast Pump   Consult Status Consult Status: Follow-up Date: 06/05/13 Follow-up type: In-patient    Alfred LevinsGranger, Natalie Esparza 06/04/2013, 8:30 PM

## 2013-06-04 NOTE — Lactation Note (Signed)
This note was copied from the chart of Natalie Trinika Esparza. Lactation Consultation Note  Patient Name: Natalie Esparza Today's Date: 06/04/2013 Reason for consult: Follow-up assessment;Difficult latch Called by RN to assist Mom with BF. Baby very fussy at the breast and not sustaining a latch. Mom reports nipple pain with or without #20 nipple shield. Mom has flat nipples but are very compressible, lots of colostrum present with hand expression. Hx of Breast augmentation. Baby is noted to have recessed chin, high palate and posterior frenulum that appears to be short restricting tongue mobility. Tried several positions with Mom and baby. Baby could latch with breast compression but after few suckles would pop off the breast, becoming very frustrated. Mom is tearful as well. The #20 nipples shield was painful when latching with this. Changed to #16 nipple shield, baby obtained more depth with latch and intially Mom reported less discomfort but as the baby continued to nurse Mom's discomfort increased and she wanted baby off the breast. Mom's nipple was round but blanched at the end. Colostrum present in the nipple shield. Baby nursed for 10 minutes. Mom is considering pump and bottle feeding. She has Medela DEBP at home. Advised Mom we could set her up with a pump to start pumping every 3 hours for 15 minutes. Mom reports she will advise. FOB may go and get her DEBP. Brought Mom some comfort gels for sore nipples, no breakdown noted, care for sore nipples reviewed. Mom reports they may try to go home this evening. Reviewed Engorgement care. Advised of OP services and support group. Will follow up tomorrow if they do not go home.   Maternal Data    Feeding Feeding Type: Breast Fed Length of feed: 10 min  LATCH Score/Interventions Latch: Repeated attempts needed to sustain latch, nipple held in mouth throughout feeding, stimulation needed to elicit sucking reflex. (see LC note) Intervention(s): Adjust  position;Assist with latch;Breast massage;Breast compression  Audible Swallowing: A few with stimulation  Type of Nipple: Flat  Comfort (Breast/Nipple): Filling, red/small blisters or bruises, mild/mod discomfort  Problem noted: Mild/Moderate discomfort Interventions (Mild/moderate discomfort): Comfort gels;Hand expression (EBM to sore nipples)  Hold (Positioning): Assistance needed to correctly position infant at breast and maintain latch.  LATCH Score: 5  Lactation Tools Discussed/Used Tools: Nipple Shields;Pump;Comfort gels Nipple shield size: 20;16 Breast pump type: Manual   Consult Status Consult Status: Follow-up Date: 06/05/13 Follow-up type: In-patient    Alfred LevinsGranger, Shannan Slinker Ann 06/04/2013, 5:32 PM

## 2013-06-04 NOTE — Discharge Summary (Signed)
`````  Attestation of Attending Supervision of Advanced Practitioner: Evaluation and management procedures were performed by the PA/NP/CNM/OB Fellow under my supervision/collaboration. Chart reviewed and agree with management and plan.  Tiffeny Minchew V 06/04/2013 10:14 AM

## 2013-06-04 NOTE — Discharge Instructions (Signed)
Postpartum Care After Vaginal Delivery °After you deliver your newborn (postpartum period), the usual stay in the hospital is 24 72 hours. If there were problems with your labor or delivery, or if you have other medical problems, you might be in the hospital longer.  °While you are in the hospital, you will receive help and instructions on how to care for yourself and your newborn during the postpartum period.  °While you are in the hospital: °· Be sure to tell your nurses if you have pain or discomfort, as well as where you feel the pain and what makes the pain worse. °· If you had an incision made near your vagina (episiotomy) or if you had some tearing during delivery, the nurses may put ice packs on your episiotomy or tear. The ice packs may help to reduce the pain and swelling. °· If you are breastfeeding, you may feel uncomfortable contractions of your uterus for a couple of weeks. This is normal. The contractions help your uterus get back to normal size. °· It is normal to have some bleeding after delivery. °· For the first 1 3 days after delivery, the flow is red and the amount may be similar to a period. °· It is common for the flow to start and stop. °· In the first few days, you may pass some small clots. Let your nurses know if you begin to pass large clots or your flow increases. °· Do not  flush blood clots down the toilet before having the nurse look at them. °· During the next 3 10 days after delivery, your flow should become more watery and pink or brown-tinged in color. °· Ten to fourteen days after delivery, your flow should be a small amount of yellowish-white discharge. °· The amount of your flow will decrease over the first few weeks after delivery. Your flow may stop in 6 8 weeks. Most women have had their flow stop by 12 weeks after delivery. °· You should change your sanitary pads frequently. °· Wash your hands thoroughly with soap and water for at least 20 seconds after changing pads, using  the toilet, or before holding or feeding your newborn. °· You should feel like you need to empty your bladder within the first 6 8 hours after delivery. °· In case you become weak, lightheaded, or faint, call your nurse before you get out of bed for the first time and before you take a shower for the first time. °· Within the first few days after delivery, your breasts may begin to feel tender and full. This is called engorgement. Breast tenderness usually goes away within 48 72 hours after engorgement occurs. You may also notice milk leaking from your breasts. If you are not breastfeeding, do not stimulate your breasts. Breast stimulation can make your breasts produce more milk. °· Spending as much time as possible with your newborn is very important. During this time, you and your newborn can feel close and get to know each other. Having your newborn stay in your room (rooming in) will help to strengthen the bond with your newborn.  It will give you time to get to know your newborn and become comfortable caring for your newborn. °· Your hormones change after delivery. Sometimes the hormone changes can temporarily cause you to feel sad or tearful. These feelings should not last more than a few days. If these feelings last longer than that, you should talk to your caregiver. °· If desired, talk to your caregiver about   methods of family planning or contraception. °· Talk to your caregiver about immunizations. Your caregiver may want you to have the following immunizations before leaving the hospital: °· Tetanus, diphtheria, and pertussis (Tdap) or tetanus and diphtheria (Td) immunization. It is very important that you and your family (including grandparents) or others caring for your newborn are up-to-date with the Tdap or Td immunizations. The Tdap or Td immunization can help protect your newborn from getting ill. °· Rubella immunization. °· Varicella (chickenpox) immunization. °· Influenza immunization. You should  receive this annual immunization if you did not receive the immunization during your pregnancy. °Document Released: 12/14/2006 Document Revised: 11/11/2011 Document Reviewed: 10/14/2011 °Exit[employer]® Patient Information ©2014 [employer]. ° °Breastfeeding °Deciding to breastfeed is one of the best choices you can make for you and your baby. A change in hormones during pregnancy causes your breast tissue to grow and increases the number and size of your milk ducts. These hormones also allow proteins, sugars, and fats from your blood supply to make breast milk in your milk-producing glands. Hormones prevent breast milk from being released before your baby is born as well as prompt milk flow after birth. Once breastfeeding has begun, thoughts of your baby, as well as his or her sucking or crying, can stimulate the release of milk from your milk-producing glands.  °BENEFITS OF BREASTFEEDING °For Your Baby °· Your first milk (colostrum) helps your baby's digestive system function better.   °· There are antibodies in your milk that help your baby fight off infections.   °· Your baby has a lower incidence of asthma, allergies, and sudden infant death syndrome.   °· The nutrients in breast milk are better for your baby than infant formulas and are designed uniquely for your baby's needs.   °· Breast milk improves your baby's brain development.   °· Your baby is less likely to develop other conditions, such as childhood obesity, asthma, or type 2 diabetes mellitus.   °For You  °· Breastfeeding helps to create a very special bond between you and your baby.   °· Breastfeeding is convenient. Breast milk is always available at the correct temperature and costs nothing.   °· Breastfeeding helps to burn calories and helps you lose the weight gained during pregnancy.   °· Breastfeeding makes your uterus contract to its prepregnancy size faster and slows bleeding (lochia) after you give birth.   °· Breastfeeding helps to lower your  risk of developing type 2 diabetes mellitus, osteoporosis, and breast or ovarian cancer later in life. °SIGNS THAT YOUR BABY IS HUNGRY °Early Signs of Hunger  °· Increased alertness or activity. °· Stretching. °· Movement of the head from side to side. °· Movement of the head and opening of the mouth when the corner of the mouth or cheek is stroked (rooting). °· Increased sucking sounds, smacking lips, cooing, sighing, or squeaking. °· Hand-to-mouth movements. °· Increased sucking of fingers or hands. °Late Signs of Hunger °· Fussing. °· Intermittent crying. °Extreme Signs of Hunger °Signs of extreme hunger will require calming and consoling before your baby will be able to breastfeed successfully. Do not wait for the following signs of extreme hunger to occur before you initiate breastfeeding:   °· Restlessness. °· A loud, strong cry. °·  Screaming. °BREASTFEEDING BASICS °Breastfeeding Initiation °· Find a comfortable place to sit or lie down, with your neck and back well supported. °· Place a pillow or rolled up blanket under your baby to bring him or her to the level of your breast (if you are seated). Nursing pillows are   specially designed to help support your arms and your baby while you breastfeed. °· Make sure that your baby's abdomen is facing your abdomen.   °· Gently massage your breast. With your fingertips, massage from your chest wall toward your nipple in a circular motion. This encourages milk flow. You may need to continue this action during the feeding if your milk flows slowly. °· Support your breast with 4 fingers underneath and your thumb above your nipple. Make sure your fingers are well away from your nipple and your baby's mouth.   °· Stroke your baby's lips gently with your finger or nipple.   °· When your baby's mouth is open wide enough, quickly bring your baby to your breast, placing your entire nipple and as much of the colored area around your nipple (areola) as possible into your baby's  mouth.   °· More areola should be visible above your baby's upper lip than below the lower lip.   °· Your baby's tongue should be between his or her lower gum and your breast.   °· Ensure that your baby's mouth is correctly positioned around your nipple (latched). Your baby's lips should create a seal on your breast and be turned out (everted). °· It is common for your baby to suck about 2 3 minutes in order to start the flow of breast milk. °Latching °Teaching your baby how to latch on to your breast properly is very important. An improper latch can cause nipple pain and decreased milk supply for you and poor weight gain in your baby. Also, if your baby is not latched onto your nipple properly, he or she may swallow some air during feeding. This can make your baby fussy. Burping your baby when you switch breasts during the feeding can help to get rid of the air. However, teaching your baby to latch on properly is still the best way to prevent fussiness from swallowing air while breastfeeding. °Signs that your baby has successfully latched on to your nipple:    °· Silent tugging or silent sucking, without causing you pain.   °· Swallowing heard between every 3 4 sucks.   °·  Muscle movement above and in front of his or her ears while sucking.   °Signs that your baby has not successfully latched on to nipple:  °· Sucking sounds or smacking sounds from your baby while breastfeeding. °· Nipple pain. °If you think your baby has not latched on correctly, slip your finger into the corner of your baby's mouth to break the suction and place it between your baby's gums. Attempt breastfeeding initiation again. °Signs of Successful Breastfeeding °Signs from your baby:   °· A gradual decrease in the number of sucks or complete cessation of sucking.   °· Falling asleep.   °· Relaxation of his or her body.   °· Retention of a small amount of milk in his or her mouth.   °· Letting go of your breast by himself or herself. °Signs  from you: °· Breasts that have increased in firmness, weight, and size 1 3 hours after feeding.   °· Breasts that are softer immediately after breastfeeding. °· Increased milk volume, as well as a change in milk consistency and color by the 5th day of breastfeeding.   °· Nipples that are not sore, cracked, or bleeding. °Signs That Your Baby is Getting Enough Milk °· Wetting at least 3 diapers in a 24-hour period. The urine should be clear and pale yellow by age 5 days. °· At least 3 stools in a 24-hour period by age 5 days. The stool   should be soft and yellow. °· At least 3 stools in a 24-hour period by age 7 days. The stool should be seedy and yellow. °· No loss of weight greater than 10% of birth weight during the first 3 days of age. °· Average weight gain of 4 7 ounces (120 210 mL) per week after age 4 days. °· Consistent daily weight gain by age 5 days, without weight loss after the age of 2 weeks. °After a feeding, your baby may spit up a small amount. This is common. °BREASTFEEDING FREQUENCY AND DURATION °Frequent feeding will help you make more milk and can prevent sore nipples and breast engorgement. Breastfeed when you feel the need to reduce the fullness of your breasts or when your baby shows signs of hunger. This is called "breastfeeding on demand." Avoid introducing a pacifier to your baby while you are working to establish breastfeeding (the first 4 6 weeks after your baby is born). After this time you may choose to use a pacifier. Research has shown that pacifier use during the first year of a baby's life decreases the risk of sudden infant death syndrome (SIDS). °Allow your baby to feed on each breast as long as he or she wants. Breastfeed until your baby is finished feeding. When your baby unlatches or falls asleep while feeding from the first breast, offer the second breast. Because newborns are often sleepy in the first few weeks of life, you may need to awaken your baby to get him or her to  feed. °Breastfeeding times will vary from baby to baby. However, the following rules can serve as a guide to help you ensure that your baby is properly fed: °· Newborns (babies 4 weeks of age or younger) may breastfeed every 1 3 hours. °· Newborns should not go longer than 3 hours during the day or 5 hours during the night without breastfeeding. °· You should breastfeed your baby a minimum of 8 times in a 24-hour period until you begin to introduce solid foods to your baby at around 6 months of age. °BREAST MILK PUMPING °Pumping and storing breast milk allows you to ensure that your baby is exclusively fed your breast milk, even at times when you are unable to breastfeed. This is especially important if you are going back to work while you are still breastfeeding or when you are not able to be present during feedings. Your lactation consultant can give you guidelines on how long it is safe to store breast milk.  °A breast pump is a machine that allows you to pump milk from your breast into a sterile bottle. The pumped breast milk can then be stored in a refrigerator or freezer. Some breast pumps are operated by hand, while others use electricity. Ask your lactation consultant which type will work best for you. Breast pumps can be purchased, but some hospitals and breastfeeding support groups lease breast pumps on a monthly basis. A lactation consultant can teach you how to hand express breast milk, if you prefer not to use a pump.  °CARING FOR YOUR BREASTS WHILE YOU BREASTFEED °Nipples can become dry, cracked, and sore while breastfeeding. The following recommendations can help keep your breasts moisturized and healthy: °· Avoid using soap on your nipples.   °· Wear a supportive bra. Although not required, special nursing bras and tank tops are designed to allow access to your breasts for breastfeeding without taking off your entire bra or top. Avoid wearing underwire style bras or extremely tight bras. °·   Air dry  your nipples for 3 4 minutes after each feeding.   °· Use only cotton bra pads to absorb leaked breast milk. Leaking of breast milk between feedings is normal.   °· Use lanolin on your nipples after breastfeeding. Lanolin helps to maintain your skin's normal moisture barrier. If you use pure lanolin you do not need to wash it off before feeding your baby again. Pure lanolin is not toxic to your baby. You may also hand express a few drops of breast milk and gently massage that milk into your nipples and allow the milk to air dry. °In the first few weeks after giving birth, some women experience extremely full breasts (engorgement). Engorgement can make your breasts feel heavy, warm, and tender to the touch. Engorgement peaks within 3 5 days after you give birth. The following recommendations can help ease engorgement: °· Completely empty your breasts while breastfeeding or pumping. You may want to start by applying warm, moist heat (in the shower or with warm water-soaked hand towels) just before feeding or pumping. This increases circulation and helps the milk flow. If your baby does not completely empty your breasts while breastfeeding, pump any extra milk after he or she is finished. °· Wear a snug bra (nursing or regular) or tank top for 1 2 days to signal your body to slightly decrease milk production. °· Apply ice packs to your breasts, unless this is too uncomfortable for you. °· Make sure that your baby is latched on and positioned properly while breastfeeding. °If engorgement persists after 48 hours of following these recommendations, contact your health care provider or a lactation consultant. °OVERALL HEALTH CARE RECOMMENDATIONS WHILE BREASTFEEDING °· Eat healthy foods. Alternate between meals and snacks, eating 3 of each per day. Because what you eat affects your breast milk, some of the foods may make your baby more irritable than usual. Avoid eating these foods if you are sure that they are negatively  affecting your baby. °· Drink milk, fruit juice, and water to satisfy your thirst (about 10 glasses a day).   °· Rest often, relax, and continue to take your prenatal vitamins to prevent fatigue, stress, and anemia. °· Continue breast self-awareness checks. °· Avoid chewing and smoking tobacco. °· Avoid alcohol and drug use. °Some medicines that may be harmful to your baby can pass through breast milk. It is important to ask your health care provider before taking any medicine, including all over-the-counter and prescription medicine as well as vitamin and herbal supplements. °It is possible to become pregnant while breastfeeding. If birth control is desired, ask your health care provider about options that will be safe for your baby. °SEEK MEDICAL CARE IF:  °· You feel like you want to stop breastfeeding or have become frustrated with breastfeeding. °· You have painful breasts or nipples. °· Your nipples are cracked or bleeding. °· Your breasts are red, tender, or warm. °· You have a swollen area on either breast. °· You have a fever or chills. °· You have nausea or vomiting. °· You have drainage other than breast milk from your nipples. °· Your breasts do not become full before feedings by the 5th day after you give birth. °· You feel sad and depressed. °· Your baby is too sleepy to eat well. °· Your baby is having trouble sleeping.   °· Your baby is wetting less than 3 diapers in a 24-hour period. °· Your baby has less than 3 stools in a 24-hour period. °· Your baby's skin or the   white part of his or her eyes becomes yellow.   °· Your baby is not gaining weight by 5 days of age. °SEEK IMMEDIATE MEDICAL CARE IF:  °· Your baby is overly tired (lethargic) and does not want to wake up and feed. °· Your baby develops an unexplained fever. °Document Released: 02/16/2005 Document Revised: 10/19/2012 Document Reviewed: 08/10/2012 °[employer]® Patient Information ©2014 [employer]. ° ° °

## 2013-06-06 ENCOUNTER — Ambulatory Visit: Payer: Self-pay

## 2013-06-06 NOTE — Lactation Note (Signed)
This note was copied from the chart of Natalie Hayla Esparza. Lactation Consultation Note  Patient Name: Natalie Esparza Today's Date: 06/06/2013 Reason for consult: Follow-up assessment Asked to consult by pediatrician, Dr. Margo AyeHall for discharge assessment and teaching. Mom reports able to nurse baby with nipple shield and not supplement with last feeding. Mom using a #16 NS on right breast, and #20 on left breast. Enc mom to massage breasts prior to nursing and to hand express drops, then to attempt to latch baby directly to breast at each feeding prior to using NS. Baby became frustrated without NS, but latched easily with #20 and fed for 25 minutes with bursts of rhythmic sucking and swallowing. Enc to tug chin to flange lower lip out. Mom concerned about possible short frenulum. Doesn't seem to be causing an issue with the latch, but can reassess at OP appoint on 06-09-13 at 9am with LC, and enc to discuss with pediatrician. Fitted mom with a #24 NS, so can adjust up for each breast as fluid at nipples allows in order to allow room to stretch nipple shaft as baby nurses. Discussed engorgement prevention and treatment measures with mom. Enc mom to call LC with any problems. [facility] for EBM storages times. Mom given comfort gels and additional SNS just in case needed before Friday appointment with LC.  Maternal Data    Feeding Feeding Type: Breast Fed Length of feed: 25 min  LATCH Score/Interventions Latch: Grasps breast easily, tongue down, lips flanged, rhythmical sucking. (Attempted without NS, baby frustrated, latched and nursed well with NS #20. Fitted with a #24 for later use.)  Audible Swallowing: Spontaneous and intermittent  Type of Nipple: Flat (Everts some in NS.)  Comfort (Breast/Nipple): Filling, red/small blisters or bruises, mild/mod discomfort (Breasts are filling.)  Interventions (Mild/moderate discomfort): Comfort gels;Hand massage;Hand  expression  Hold (Positioning): No assistance needed to correctly position infant at breast.  LATCH Score: 8  Lactation Tools Discussed/Used Tools: Pump;Comfort gels Nipple shield size: 16;20;24 Breast pump type: Manual Pump Review: Milk Storage   Consult Status Consult Status: Follow-up Date: 06/09/13 Follow-up type: Out-patient    Geralynn OchsWILLIARD, Anyssa Sharpless 06/06/2013, 12:13 PM

## 2013-06-06 NOTE — H&P (Signed)
`````  Attestation of Attending Supervision of Advanced Practitioner: Evaluation and management procedures were performed by the PA/NP/CNM/OB Fellow under my supervision/collaboration. Chart reviewed and agree with management and plan.  Jenna Ardoin V 06/06/2013 10:57 PM

## 2013-06-08 ENCOUNTER — Encounter: Payer: 59 | Admitting: Obstetrics & Gynecology

## 2013-06-13 NOTE — MAU Provider Note (Signed)
Attestation of Attending Supervision of Advanced Practitioner (CNM/NP): Evaluation and management procedures were performed by the Advanced Practitioner under my supervision and collaboration. I have reviewed the Advanced Practitioner's note and chart, and I agree with the management and plan.  Lesly DukesKelly H Dalilah Curlin 6:21 PM

## 2013-07-13 ENCOUNTER — Ambulatory Visit (INDEPENDENT_AMBULATORY_CARE_PROVIDER_SITE_OTHER): Payer: 59 | Admitting: Obstetrics & Gynecology

## 2013-07-13 ENCOUNTER — Encounter: Payer: Self-pay | Admitting: Obstetrics & Gynecology

## 2013-07-13 NOTE — Progress Notes (Signed)
   Subjective:    Patient ID: Natalie Esparza, female    DOB: 08/10/1985, 28 y.o.   MRN: [id number]  HPI She is here for her PP visit 6 weeks after a NSVD with 1st degree labial tears. She has had sex and noted some dryness. She is breastfeeding and says that her daughter is doing well. She wishes for permanent sterility in the form of a tubal ligation. We discussed LARCs and vasectomy. She declines these options. I discussed the study that shows regret on the part of some women who get a tubal ligation. I discussed the type of procedure. I offered Filsche clips versus excision of her oviducts to help prevent ovarian cancer. [facility] She does have [place] but had no bleeding during delivery or post partum.  She reports some constipation and will used Miralax as I have recommended. She reports normal bladder function. She denies depression. 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   Review of Systems     Objective:   Physical Exam  Well-healed vulva NSSA, NT, mobile, normal adnexal exam She is having a period now.      Assessment & Plan:  Contraception- plan for BTL Preventative care- schedule annual in 6 months

## 2013-09-12 ENCOUNTER — Encounter: Payer: Self-pay | Admitting: Obstetrics & Gynecology

## 2013-09-12 ENCOUNTER — Ambulatory Visit (INDEPENDENT_AMBULATORY_CARE_PROVIDER_SITE_OTHER): Payer: 59 | Admitting: Obstetrics & Gynecology

## 2013-09-12 VITALS — BP 101/64 | HR 82 | Resp 16 | Ht 68.0 in | Wt 186.0 lb

## 2013-09-12 DIAGNOSIS — Z01812 Encounter for preprocedural laboratory examination: Secondary | ICD-10-CM

## 2013-09-12 DIAGNOSIS — O9081 Anemia of the puerperium: Secondary | ICD-10-CM

## 2013-09-12 DIAGNOSIS — D649 Anemia, unspecified: Secondary | ICD-10-CM

## 2013-09-12 DIAGNOSIS — R42 Dizziness and giddiness: Secondary | ICD-10-CM

## 2013-09-12 DIAGNOSIS — Z3009 Encounter for other general counseling and advice on contraception: Secondary | ICD-10-CM

## 2013-09-12 LAB — POCT URINE PREGNANCY: Preg Test, Ur: NEGATIVE

## 2013-09-12 MED ORDER — NORETHINDRONE 0.35 MG PO TABS: 1.0000 | ORAL_TABLET | Freq: Every day | ORAL | Status: DC

## 2013-09-12 NOTE — Patient Instructions (Signed)
Norethindrone tablets (contraception) What is this medicine? NORETHINDRONE (nor eth IN drone) is an oral contraceptive. The product contains a female hormone known as a progestin. It is used to prevent pregnancy. This medicine may be used for other purposes; ask your health care provider or pharmacist if you have questions. COMMON BRAND NAME(S): Camila, Deblitane 28-Day, Errin, Heather, Jencycla, Jolivette, Lyza, Nor-QD, Nora-BE, Norlyroc, Ortho Micronor, Sharobel 28-Day What should I tell my health care provider before I take this medicine? They need to know if you have any of these conditions: -blood vessel disease or blood clots -breast, cervical, or vaginal cancer -diabetes -heart disease -kidney disease -liver disease -mental depression -migraine -seizures -stroke -vaginal bleeding -an unusual or allergic reaction to norethindrone, other medicines, foods, dyes, or preservatives -pregnant or trying to get pregnant -breast-feeding How should I use this medicine? Take this medicine by mouth with a glass of water. You may take it with or without food. Follow the directions on the prescription label. Take this medicine at the same time each day and in the order directed on the package. Do not take your medicine more often than directed. Contact your pediatrician regarding the use of this medicine in children. Special care may be needed. This medicine has been used in female children who have started having menstrual periods. A patient package insert for the product will be given with each prescription and refill. Read this sheet carefully each time. The sheet may change frequently. Overdosage: If you think you have taken too much of this medicine contact a poison control center or emergency room at once. NOTE: This medicine is only for you. Do not share this medicine with others. What if I miss a dose? Try not to miss a dose. Every time you miss a dose or take a dose late your chance of  pregnancy increases. When 1 pill is missed (even if only 3 hours late), take the missed pill as soon as possible and continue taking a pill each day at the regular time (use a back up method of birth control for the next 48 hours). If more than 1 dose is missed, use an additional birth control method for the rest of your pill pack until menses occurs. Contact your health care professional if more than 1 dose has been missed. What may interact with this medicine? Do not take this medicine with any of the following medications: -amprenavir or fosamprenavir -bosentan This medicine may also interact with the following medications: -antibiotics or medicines for infections, especially rifampin, rifabutin, rifapentine, and griseofulvin, and possibly penicillins or tetracyclines -aprepitant -barbiturate medicines, such as phenobarbital -carbamazepine -felbamate -modafinil -oxcarbazepine -phenytoin -ritonavir or other medicines for HIV infection or AIDS -St. John's wort -topiramate This list may not describe all possible interactions. Give your health care provider a list of all the medicines, herbs, non-prescription drugs, or dietary supplements you use. Also tell them if you smoke, drink alcohol, or use illegal drugs. Some items may interact with your medicine. What should I watch for while using this medicine? Visit your doctor or health care professional for regular checks on your progress. You will need a regular breast and pelvic exam and Pap smear while on this medicine. Use an additional method of birth control during the first cycle that you take these tablets. If you have any reason to think you are pregnant, stop taking this medicine right away and contact your doctor or health care professional. If you are taking this medicine for hormone related problems, it   may take several cycles of use to see improvement in your condition. This medicine does not protect you against HIV infection (AIDS)  or any other sexually transmitted diseases. What side effects may I notice from receiving this medicine? Side effects that you should report to your doctor or health care professional as soon as possible: -breast tenderness or discharge -pain in the abdomen, chest, groin or leg -severe headache -skin rash, itching, or hives -sudden shortness of breath -unusually weak or tired -vision or speech problems -yellowing of skin or eyes Side effects that usually do not require medical attention (report to your doctor or health care professional if they continue or are bothersome): -changes in sexual desire -change in menstrual flow -facial hair growth -fluid retention and swelling -headache -irritability -nausea -weight gain or loss This list may not describe all possible side effects. Call your doctor for medical advice about side effects. You may report side effects to FDA at 1-800-FDA-1088. Where should I keep my medicine? Keep out of the reach of children. Store at room temperature between 15 and 30 degrees C (59 and 86 degrees F). Throw away any unused medicine after the expiration date. NOTE: This sheet is a summary. It may not cover all possible information. If you have questions about this medicine, talk to your doctor, pharmacist, or health care provider.  2015, [employer]. (2011-11-06 16:41:35)  

## 2013-09-12 NOTE — Addendum Note (Signed)
Addended by: Granville LewisLARK, LORA L on: 09/12/2013 03:18 PM   Modules accepted: Orders

## 2013-09-12 NOTE — Progress Notes (Signed)
Subjective:     Patient ID: Natalie Esparza, female   DOB: 01/02/1986, 28 y.o.   MRN: [id number]  HPI Pt was seen and reviewed the desire for a sterilization.  She now desires to get pregnant right away.  She also c/o nausea and dizziness since last night.     Review of Systems     Objective:   Physical Exam BP 101/64  Pulse 82  Resp 16  Ht 5\' 8"  (1.727 m)  Wt 186 lb (84.369 kg)  BMI 28.29 kg/m2  Breastfeeding? Yes Exam deferred  UPT- neg     Assessment:     Anemia- Increase iron in diet; FeSo4; keep PNV  Contraception counseling Preconception counseling rec pregnancy spacing of 2 years    Plan:     FeSO4 bid Keep PNV UPT If UPT neg rec Micronor

## 2013-09-13 LAB — CBC
HEMATOCRIT: 38.7 % (ref 36.0–46.0)
Hemoglobin: 12.5 g/dL (ref 12.0–15.0)
MCH: 27.1 pg (ref 26.0–34.0)
MCHC: 32.3 g/dL (ref 30.0–36.0)
MCV: 83.8 fL (ref 78.0–100.0)
Platelets: 353 10*3/uL (ref 150–400)
RBC: 4.62 MIL/uL (ref 3.87–5.11)
RDW: 14.9 % (ref 11.5–15.5)
WBC: 5.7 10*3/uL (ref 4.0–10.5)

## 2013-09-19 ENCOUNTER — Encounter (HOSPITAL_COMMUNITY): Payer: Self-pay | Admitting: Pharmacy Technician

## 2013-09-29 ENCOUNTER — Encounter (HOSPITAL_COMMUNITY): Admission: RE | Payer: Self-pay | Source: Ambulatory Visit

## 2013-09-29 ENCOUNTER — Ambulatory Visit: Payer: 59 | Admitting: Hematology & Oncology

## 2013-09-29 ENCOUNTER — Ambulatory Visit (HOSPITAL_COMMUNITY)
Admission: RE | Admit: 2013-09-29 | Payer: BC Managed Care – PPO | Source: Ambulatory Visit | Admitting: Obstetrics & Gynecology

## 2013-09-29 ENCOUNTER — Telehealth: Payer: Self-pay | Admitting: Hematology & Oncology

## 2013-09-29 ENCOUNTER — Other Ambulatory Visit: Payer: 59 | Admitting: Lab

## 2013-09-29 SURGERY — LIGATION, FALLOPIAN TUBE, LAPAROSCOPIC
Anesthesia: General | Laterality: Bilateral

## 2013-09-29 NOTE — Telephone Encounter (Signed)
Left pt message to call and reschedule missed appointment from today °

## 2013-10-31 ENCOUNTER — Ambulatory Visit (INDEPENDENT_AMBULATORY_CARE_PROVIDER_SITE_OTHER): Payer: 59 | Admitting: Obstetrics & Gynecology

## 2013-10-31 ENCOUNTER — Encounter: Payer: Self-pay | Admitting: Obstetrics & Gynecology

## 2013-10-31 VITALS — BP 115/70 | HR 89 | Resp 16 | Wt 192.0 lb

## 2013-10-31 DIAGNOSIS — N841 Polyp of cervix uteri: Secondary | ICD-10-CM

## 2013-10-31 DIAGNOSIS — O925 Suppressed lactation: Secondary | ICD-10-CM

## 2013-10-31 DIAGNOSIS — Z124 Encounter for screening for malignant neoplasm of cervix: Secondary | ICD-10-CM

## 2013-10-31 DIAGNOSIS — Z01419 Encounter for gynecological examination (general) (routine) without abnormal findings: Secondary | ICD-10-CM

## 2013-10-31 DIAGNOSIS — Z113 Encounter for screening for infections with a predominantly sexual mode of transmission: Secondary | ICD-10-CM

## 2013-10-31 MED ORDER — METOCLOPRAMIDE HCL 10 MG PO TABS: 10.0000 mg | ORAL_TABLET | Freq: Two times a day (BID) | ORAL | Status: AC

## 2013-10-31 NOTE — Progress Notes (Signed)
   Subjective:    Patient ID: Natalie Esparza, female    DOB: 1985-09-09, 28 y.o.   MRN: [id number]  Gynecologic Exam    Pt requesting reglan to help with breast feeding (production).  Pt also needs a pap smear.  She refused during pregnancy and is about to lose her insurance  Review of Systems    Negative review of symptoms Objective:   Physical Exam  Vitals reviewed. Constitutional: She is oriented to person, place, and time. She appears well-developed and well-nourished. No distress.  HENT:  Head: Normocephalic and atraumatic.  Pulmonary/Chest: Effort normal.  Abdominal: Soft.  Genitourinary: Vagina normal.  Cervix--small endocervical polyp/friable  Neurological: She is alert and oriented to person, place, and time.  Skin: Skin is warm and dry.  Psychiatric: She has a normal mood and affect.          Assessment & Plan:  Reglan for breast feeding Pap smear  Procedure  After informed consent was obtained, the pt was placed in dorsal lithotomy position.  A bivalved speculum was placed into the vagina.  The cervix was brought into view.  The cervix was cleaned with betadine x2.  A polyp forcep was used to grasp the endocervical polyp and the polyp was removed by twisting the base.  The polyp was sent to pathology.  There was scant bleeding at the end of the procedure.   We will call pt with the pathology results next week.  If the patient continues to bleed or spot longer than one week, she will need to return for further evaluation.

## 2013-11-02 LAB — CYTOLOGY - PAP

## 2014-01-01 ENCOUNTER — Encounter: Payer: Self-pay | Admitting: Obstetrics & Gynecology
